# Patient Record
Sex: Male | Born: 1980 | Race: White | Hispanic: No | Marital: Single | Smoking: Former smoker
Health system: Southern US, Academic
[De-identification: ages and names within clinical notes are randomized; demographics above are authoritative.]

## PROBLEM LIST (undated history)

## (undated) DIAGNOSIS — J45909 Unspecified asthma, uncomplicated: Secondary | ICD-10-CM

## (undated) DIAGNOSIS — F419 Anxiety disorder, unspecified: Secondary | ICD-10-CM

## (undated) DIAGNOSIS — F329 Major depressive disorder, single episode, unspecified: Secondary | ICD-10-CM

## (undated) DIAGNOSIS — F319 Bipolar disorder, unspecified: Secondary | ICD-10-CM

## (undated) DIAGNOSIS — F32A Depression, unspecified: Secondary | ICD-10-CM

## (undated) HISTORY — PX: CHOLECYSTECTOMY: SHX55

## (undated) HISTORY — DX: Bipolar disorder, unspecified: F31.9

## (undated) HISTORY — PX: ABDOMINAL HERNIA REPAIR: SHX539

## (undated) HISTORY — DX: Anxiety disorder, unspecified: F41.9

## (undated) HISTORY — DX: Unspecified asthma, uncomplicated: J45.909

## (undated) HISTORY — DX: Depression, unspecified: F32.A

## (undated) HISTORY — DX: Bipolar disorder, unspecified (CMS HCC): F31.9

---
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
## 2003-04-17 ENCOUNTER — Ambulatory Visit (INDEPENDENT_AMBULATORY_CARE_PROVIDER_SITE_OTHER): Payer: Self-pay | Admitting: Family Medicine

## 2003-05-11 ENCOUNTER — Ambulatory Visit (INDEPENDENT_AMBULATORY_CARE_PROVIDER_SITE_OTHER): Payer: Self-pay | Admitting: Family Medicine

## 2003-06-08 ENCOUNTER — Ambulatory Visit (INDEPENDENT_AMBULATORY_CARE_PROVIDER_SITE_OTHER): Payer: Self-pay

## 2003-07-06 ENCOUNTER — Ambulatory Visit (INDEPENDENT_AMBULATORY_CARE_PROVIDER_SITE_OTHER): Payer: Self-pay | Admitting: Family Medicine

## 2003-09-30 ENCOUNTER — Ambulatory Visit (HOSPITAL_COMMUNITY): Payer: Self-pay

## 2003-10-04 ENCOUNTER — Ambulatory Visit (INDEPENDENT_AMBULATORY_CARE_PROVIDER_SITE_OTHER): Payer: Self-pay | Admitting: Family Medicine

## 2003-10-06 ENCOUNTER — Ambulatory Visit (HOSPITAL_COMMUNITY): Payer: Self-pay

## 2003-10-13 ENCOUNTER — Encounter (FREE_STANDING_LABORATORY_FACILITY): Payer: Self-pay | Admitting: Pathology

## 2003-10-13 ENCOUNTER — Ambulatory Visit (HOSPITAL_COMMUNITY): Payer: Self-pay

## 2003-11-02 ENCOUNTER — Ambulatory Visit (HOSPITAL_COMMUNITY): Payer: Self-pay

## 2003-11-24 ENCOUNTER — Ambulatory Visit (HOSPITAL_COMMUNITY): Payer: Self-pay

## 2003-12-06 ENCOUNTER — Ambulatory Visit (INDEPENDENT_AMBULATORY_CARE_PROVIDER_SITE_OTHER): Payer: Self-pay | Admitting: Family Medicine

## 2003-12-29 ENCOUNTER — Ambulatory Visit (HOSPITAL_COMMUNITY): Payer: Self-pay

## 2004-03-31 ENCOUNTER — Ambulatory Visit (HOSPITAL_COMMUNITY): Payer: Self-pay

## 2004-04-11 ENCOUNTER — Ambulatory Visit (INDEPENDENT_AMBULATORY_CARE_PROVIDER_SITE_OTHER): Payer: Self-pay | Admitting: Family Medicine

## 2005-01-17 ENCOUNTER — Ambulatory Visit (INDEPENDENT_AMBULATORY_CARE_PROVIDER_SITE_OTHER): Payer: Self-pay

## 2005-03-16 ENCOUNTER — Ambulatory Visit (INDEPENDENT_AMBULATORY_CARE_PROVIDER_SITE_OTHER): Payer: Self-pay

## 2005-05-01 ENCOUNTER — Ambulatory Visit (INDEPENDENT_AMBULATORY_CARE_PROVIDER_SITE_OTHER): Payer: Self-pay

## 2005-05-04 ENCOUNTER — Ambulatory Visit (INDEPENDENT_AMBULATORY_CARE_PROVIDER_SITE_OTHER): Payer: Self-pay

## 2005-10-11 ENCOUNTER — Ambulatory Visit (INDEPENDENT_AMBULATORY_CARE_PROVIDER_SITE_OTHER): Payer: Self-pay

## 2005-11-19 ENCOUNTER — Ambulatory Visit (INDEPENDENT_AMBULATORY_CARE_PROVIDER_SITE_OTHER): Payer: Self-pay | Admitting: Geriatric Medicine

## 2005-11-22 ENCOUNTER — Ambulatory Visit (INDEPENDENT_AMBULATORY_CARE_PROVIDER_SITE_OTHER): Payer: Self-pay

## 2005-12-24 ENCOUNTER — Ambulatory Visit (INDEPENDENT_AMBULATORY_CARE_PROVIDER_SITE_OTHER): Payer: Self-pay

## 2006-04-25 ENCOUNTER — Ambulatory Visit (INDEPENDENT_AMBULATORY_CARE_PROVIDER_SITE_OTHER): Payer: Self-pay | Admitting: Blood Banking & Transfusion Medicine

## 2006-07-25 ENCOUNTER — Ambulatory Visit (INDEPENDENT_AMBULATORY_CARE_PROVIDER_SITE_OTHER): Payer: Self-pay | Admitting: Blood Banking & Transfusion Medicine

## 2006-07-25 ENCOUNTER — Other Ambulatory Visit (INDEPENDENT_AMBULATORY_CARE_PROVIDER_SITE_OTHER): Payer: Self-pay

## 2006-09-26 ENCOUNTER — Ambulatory Visit (HOSPITAL_COMMUNITY): Payer: Self-pay

## 2006-10-24 ENCOUNTER — Ambulatory Visit (HOSPITAL_COMMUNITY): Payer: Self-pay

## 2006-12-19 ENCOUNTER — Encounter (HOSPITAL_COMMUNITY): Payer: Self-pay | Admitting: Student in an Organized Health Care Education/Training Program

## 2007-01-23 ENCOUNTER — Encounter (INDEPENDENT_AMBULATORY_CARE_PROVIDER_SITE_OTHER): Payer: Self-pay

## 2007-02-06 ENCOUNTER — Encounter (INDEPENDENT_AMBULATORY_CARE_PROVIDER_SITE_OTHER): Payer: Self-pay | Admitting: Student in an Organized Health Care Education/Training Program

## 2007-02-17 ENCOUNTER — Encounter (FREE_STANDING_LABORATORY_FACILITY)
Admit: 2007-02-17 | Discharge: 2007-02-17 | Disposition: A | Discharge status: Home / Self Care | Payer: Self-pay | Attending: Student in an Organized Health Care Education/Training Program | Admitting: Student in an Organized Health Care Education/Training Program

## 2007-02-17 DIAGNOSIS — F319 Bipolar disorder, unspecified: Secondary | ICD-10-CM | POA: Diagnosis present

## 2007-03-13 ENCOUNTER — Encounter (HOSPITAL_COMMUNITY): Payer: Self-pay | Admitting: Student in an Organized Health Care Education/Training Program

## 2007-03-27 ENCOUNTER — Encounter (HOSPITAL_COMMUNITY): Payer: Self-pay | Admitting: Student in an Organized Health Care Education/Training Program

## 2007-04-11 ENCOUNTER — Encounter (HOSPITAL_COMMUNITY): Payer: Self-pay | Admitting: Student in an Organized Health Care Education/Training Program

## 2007-04-11 ENCOUNTER — Other Ambulatory Visit (INDEPENDENT_AMBULATORY_CARE_PROVIDER_SITE_OTHER): Payer: Self-pay

## 2007-04-11 ENCOUNTER — Encounter (INDEPENDENT_AMBULATORY_CARE_PROVIDER_SITE_OTHER): Payer: Self-pay

## 2007-06-06 ENCOUNTER — Encounter (HOSPITAL_COMMUNITY): Payer: Self-pay

## 2007-10-24 ENCOUNTER — Encounter (HOSPITAL_COMMUNITY): Payer: Self-pay

## 2007-11-07 ENCOUNTER — Encounter (INDEPENDENT_AMBULATORY_CARE_PROVIDER_SITE_OTHER): Payer: Self-pay

## 2007-11-07 ENCOUNTER — Encounter (FREE_STANDING_LABORATORY_FACILITY): Admit: 2007-11-07 | Discharge: 2007-11-07 | Disposition: A | Discharge status: Home / Self Care | Payer: Self-pay

## 2007-11-07 DIAGNOSIS — F319 Bipolar disorder, unspecified: Secondary | ICD-10-CM | POA: Diagnosis present

## 2007-11-07 LAB — CREATININE WITH EGFR: ESTIMATED GLOMERULAR FILTRATION RATE: >59 ml/min/1.73m2 (ref >59)

## 2007-11-07 LAB — LITHIUM LEVEL: LITHIUM: 0.24 mmol/L — ABNORMAL LOW (ref 0.5–1.5)

## 2008-01-02 ENCOUNTER — Ambulatory Visit (INDEPENDENT_AMBULATORY_CARE_PROVIDER_SITE_OTHER): Payer: Self-pay

## 2008-01-02 MED ORDER — LITHIUM CARBONATE ER 450 MG TABLET,EXTENDED RELEASE
450.00 mg | ORAL_TABLET | Freq: Two times a day (BID) | ORAL | Status: DC
Start: 2008-01-02 — End: 2008-03-22

## 2008-01-02 MED ORDER — BUSPIRONE 10 MG TABLET
10.00 mg | ORAL_TABLET | Freq: Three times a day (TID) | ORAL | Status: DC
Start: 2008-01-02 — End: 2008-03-22

## 2008-01-02 MED ORDER — SERTRALINE 100 MG TABLET
100.00 mg | ORAL_TABLET | Freq: Two times a day (BID) | ORAL | Status: DC
Start: 2008-01-02 — End: 2008-03-22

## 2008-01-23 NOTE — Progress Notes (Signed)
I saw and evaluated the patient. I reviewed the resident's note. I agree with the findings and plan of care as documented in the resident's note. Any exceptions/additions are noted.

## 2008-03-09 ENCOUNTER — Encounter (HOSPITAL_COMMUNITY): Payer: Self-pay

## 2008-03-22 ENCOUNTER — Ambulatory Visit (INDEPENDENT_AMBULATORY_CARE_PROVIDER_SITE_OTHER): Payer: Self-pay

## 2008-03-22 MED ORDER — LITHIUM CARBONATE ER 450 MG TABLET,EXTENDED RELEASE
450.00 mg | ORAL_TABLET | Freq: Two times a day (BID) | ORAL | Status: DC
Start: 2008-03-22 — End: 2008-09-01

## 2008-03-22 MED ORDER — SERTRALINE 100 MG TABLET
100.00 mg | ORAL_TABLET | Freq: Two times a day (BID) | ORAL | Status: DC
Start: 2008-03-22 — End: 2008-09-01

## 2008-03-22 MED ORDER — BUSPIRONE 15 MG TABLET
15.00 mg | ORAL_TABLET | Freq: Two times a day (BID) | ORAL | Status: DC
Start: 2008-03-22 — End: 2008-09-01

## 2008-05-24 ENCOUNTER — Encounter (HOSPITAL_COMMUNITY): Payer: Self-pay

## 2008-07-05 ENCOUNTER — Encounter (HOSPITAL_COMMUNITY): Payer: Self-pay

## 2008-09-01 ENCOUNTER — Ambulatory Visit (INDEPENDENT_AMBULATORY_CARE_PROVIDER_SITE_OTHER): Payer: Self-pay

## 2008-09-01 ENCOUNTER — Encounter (FREE_STANDING_LABORATORY_FACILITY): Admit: 2008-09-01 | Discharge: 2008-09-01 | Disposition: A | Discharge status: Home / Self Care | Payer: Self-pay

## 2008-09-01 DIAGNOSIS — F319 Bipolar disorder, unspecified: Secondary | ICD-10-CM | POA: Diagnosis present

## 2008-09-01 LAB — LITHIUM LEVEL
DATE OF LAST DOSE: 122309
LITHIUM: 0.29 mmol/L — ABNORMAL LOW (ref 0.5–1.5)
TIME OF LAST DOSE: 2400

## 2008-09-01 LAB — CREATININE WITH EGFR: ESTIMATED GLOMERULAR FILTRATION RATE: >59 ml/min/1.73m2 (ref >59)

## 2008-09-01 MED ORDER — LITHIUM CARBONATE ER 450 MG TABLET,EXTENDED RELEASE
450.00 mg | ORAL_TABLET | Freq: Two times a day (BID) | ORAL | Status: DC
Start: 2008-09-01 — End: 2008-11-24

## 2008-09-01 MED ORDER — SERTRALINE 100 MG TABLET
100.00 mg | ORAL_TABLET | Freq: Two times a day (BID) | ORAL | Status: DC
Start: 2008-09-01 — End: 2008-11-24

## 2008-09-01 MED ORDER — BUSPIRONE 15 MG TABLET
15.00 mg | ORAL_TABLET | Freq: Two times a day (BID) | ORAL | Status: DC
Start: 2008-09-01 — End: 2008-11-24

## 2008-09-29 ENCOUNTER — Encounter (INDEPENDENT_AMBULATORY_CARE_PROVIDER_SITE_OTHER): Payer: Self-pay | Admitting: Psychology

## 2008-10-13 ENCOUNTER — Encounter (HOSPITAL_COMMUNITY): Payer: Self-pay | Admitting: Psychology

## 2008-11-11 ENCOUNTER — Encounter (HOSPITAL_COMMUNITY): Payer: Self-pay | Admitting: Psychology

## 2008-11-24 ENCOUNTER — Ambulatory Visit (INDEPENDENT_AMBULATORY_CARE_PROVIDER_SITE_OTHER): Payer: Self-pay

## 2008-11-24 ENCOUNTER — Encounter (HOSPITAL_COMMUNITY): Payer: Self-pay

## 2008-11-24 MED ORDER — SERTRALINE 100 MG TABLET
100.00 mg | ORAL_TABLET | Freq: Two times a day (BID) | ORAL | Status: DC
Start: 2008-11-24 — End: 2009-06-02

## 2008-11-24 MED ORDER — LITHIUM CARBONATE ER 450 MG TABLET,EXTENDED RELEASE
450.00 mg | ORAL_TABLET | Freq: Two times a day (BID) | ORAL | Status: DC
Start: 2008-11-24 — End: 2009-06-02

## 2008-11-24 MED ORDER — BUSPIRONE 15 MG TABLET
15.00 mg | ORAL_TABLET | Freq: Two times a day (BID) | ORAL | Status: DC
Start: 2008-11-24 — End: 2009-06-02

## 2008-11-30 ENCOUNTER — Encounter (HOSPITAL_COMMUNITY): Payer: Self-pay | Admitting: Psychology

## 2008-12-01 ENCOUNTER — Encounter (HOSPITAL_COMMUNITY): Payer: Self-pay

## 2009-01-14 ENCOUNTER — Encounter (HOSPITAL_COMMUNITY): Payer: Self-pay | Admitting: Psychology

## 2009-02-21 NOTE — Behavioral Health (Signed)
 [facility]  Department of Behavioral Medicine and Psychiatry  Outpatient Services  [address]  [place], [place] [address]      PATIENT NAME: Frederick Schultz, Frederick Schultz  CHART NUMBER: [id number]  DATE OF BIRTH: 23-Jul-1981  DATE OF SERVICE: 09/29/2008    TIME IN:  11:30 a.m.  TIME OUT:  12:15 p.m.    SUBJECTIVE: This is my first session with the patient on referral from Dr. Avis. Although he has a diagnosis of social phobia, his presentation is unusual. He talks at length about seeing shortcomings in other people and being very frustrated because they have friends and happy lives and he does not. At the same time, he acknowledges some loneliness and the fact that he has only one friend. His frustration at people is sufficient that he quit several jobs because he was so frustrated, he feared that he might lash out at somebody in anger. He states that he sleeps much of the day because there is nothing to get up for.    OBJECTIVE: Mood was anxious and frustrated, affect was congruent and consistent with mood.    ASSESSMENT:  Axis I: Bipolar disorder.   Social phobia.  Axis II: Deferred.  Axis III: None.  Axis IV: Personal, social, and financial stressors  Axis IV: GAF = 45.    PLAN: He has concerns about the financial cost of therapy, but did agree to come back in two weeks.      DOROTHA Glendia Bough, PhD, ABPP, FAED  Professor; Director; Eating, Body Image & Weight Disorder Program  [place] Department of Behavioral Medicine    GF/FD/[id number]; D: 09/29/2008 12:41:21; T: 10/01/2008 07:01:45

## 2009-03-05 ENCOUNTER — Emergency Department (HOSPITAL_COMMUNITY): Admission: EM | Admit: 2009-03-05 | Discharge: 2009-03-06 | Payer: Self-pay | Admitting: Emergency Medicine

## 2009-03-22 ENCOUNTER — Encounter (HOSPITAL_COMMUNITY): Payer: Self-pay | Admitting: Psychiatry

## 2009-06-02 ENCOUNTER — Ambulatory Visit (INDEPENDENT_AMBULATORY_CARE_PROVIDER_SITE_OTHER): Payer: Self-pay | Admitting: Psychiatry

## 2009-06-02 MED ORDER — SERTRALINE 100 MG TABLET
100.00 mg | ORAL_TABLET | Freq: Two times a day (BID) | ORAL | Status: DC
Start: 2009-06-02 — End: 2012-08-18

## 2009-06-02 MED ORDER — LITHIUM CARBONATE ER 450 MG TABLET,EXTENDED RELEASE
450.00 mg | ORAL_TABLET | Freq: Two times a day (BID) | ORAL | Status: DC
Start: 2009-06-02 — End: 2012-08-18

## 2009-06-02 NOTE — Progress Notes (Signed)
Frederick Schultz   [id number]  1981-07-28  06/02/2009  DOS: 06/02/2009      CC:  I am here for my medication    SUBJECTIVE:  Pt has been seeing Dr. Maryan Rued bipolar disorder, panic attacks and social anxiety disorder.  Pt recently started school, in general studies and has a pending remedical math class.  Wants to major in Psychology. No episodes of mania or mood lability or issues there.  Pt states hasnt taken buspar states hadnt worked well.  No SI HI no AVH    OBJECTIVE:     Orientation: A and O X 3  Appearance:  Well groomed, pleasant and cooperative  Eye Contact:  good  Attention:  Adequate for conversation  Speech:  Regular rate and volume  Motor:  No movement abnormalities  Mood:  eythymic  Affect:  congruent  Thought Process:  Linear and goal directed  Thought Content:  No SI or HI. No delusions, preoccupations or phobias  Perception:  No auditory or visual hallucinations  Cognition:  With in normal limits  Insight:  fair  Judgement:  Fair    There were no vitals filed for this visit.    MEDICATIONS:  Current outpatient prescriptions   Medication Sig   . lithium carbonate (ESKALITH CR) 450 mg TbSR take 1 Tab by mouth Twice daily.    . sertraline (ZOLOFT) 100 mg Tab take 1 Tab by mouth Twice daily.    Marland Kitchen DISCONTD: busPIRone (BUSPAR) 15 mg Tab take 1 Tab by mouth Twice daily.    Marland Kitchen DISCONTD: tramadol (ULTRAM) 50 mg Tab 2 tablets, oral, once every eight hours, PRN         ASSESSMENT:  Patient Active Problem List   Diagnoses Code   . Asthma 493.10   . Hyperlipidemia 272.2   . Hypertension 401.9     Assesment:     Axis I Bipolar disorder Type I            Social anxiety disorder            Panic disorder  PLAN:      Check lithium level in AM instructed to return to lab  Check TSH and BMP   Renew patients medications as above.  RTC 2 months    Patient advised to call with any questions or concerns. Patient advised to report to nearest emergency department or to call 911 if having any suicidal or homicidal ideations.

## 2009-06-30 NOTE — Progress Notes (Signed)
I saw and evaluated the patient. I reviewed the resident's note. I agree with the findings and plan of care as documented in the resident's note. Any exceptions/additions are noted.      Kenadee Gates P Kashay Cavenaugh, MD 06/30/2009, 3:18 PM

## 2009-07-28 ENCOUNTER — Encounter (HOSPITAL_COMMUNITY): Payer: Self-pay | Admitting: Psychiatry

## 2009-11-14 ENCOUNTER — Encounter (HOSPITAL_COMMUNITY): Payer: Self-pay | Admitting: Psychiatry

## 2011-12-24 ENCOUNTER — Ambulatory Visit (INDEPENDENT_AMBULATORY_CARE_PROVIDER_SITE_OTHER): Payer: Self-pay

## 2011-12-24 ENCOUNTER — Encounter (INDEPENDENT_AMBULATORY_CARE_PROVIDER_SITE_OTHER): Payer: Self-pay

## 2011-12-24 VITALS — BP 151/109 | HR 76 | Temp 98.6°F | Resp 18 | Wt 224.2 lb

## 2011-12-24 MED ORDER — BENZONATATE 200 MG CAPSULE
200.00 mg | ORAL_CAPSULE | Freq: Three times a day (TID) | ORAL | Status: DC | PRN
Start: 2011-12-24 — End: 2012-08-13

## 2011-12-24 MED ORDER — FLUTICASONE PROPIONATE 50 MCG/ACTUATION NASAL SPRAY,SUSPENSION
1.0000 | Freq: Every day | NASAL | Status: DC
Start: 2011-12-24 — End: 2012-08-13

## 2011-12-24 NOTE — Patient Instructions (Signed)
 Cool Mist Vaporizers  Vaporizers may help relieve the symptoms of a cough and cold. By adding water to the air, mucus may become thinner and less sticky. This makes it easier to breathe and cough up secretions. Vaporizers have not been proven to show they help with colds. You should not use a vaporizer if you are allergic to mold. Cool mist vaporizers do not cause serious burns like hot mist vaporizers (steamers).  HOME CARE INSTRUCTIONS   Follow the package instructions for your vaporizer.    Use a vaporizer that holds a large volume of water (1 to 2 gallons [5.7 to 7.5 liters]).    Do not use anything other than distilled water in the vaporizer.    Do not run the vaporizer all of the time. This can cause mold or bacteria to grow in the vaporizer.    Clean the vaporizer after each time you use it.    Clean and dry the vaporizer well before you store it.    Stop using a vaporizer if you develop worsening respiratory symptoms.   Document Released: 05/24/2004 Document Revised: 08/16/2011 Document Reviewed: 04/21/2009  [facility] Patient Information 2012 [place], [place].    Cough, Viral  You have been seen today for your cough. Your caregiver feels that your cough is caused by a virus. Viral infections must run their course and will not respond to antibiotics.  The following information may or may not apply to you. It is possible that some of your lab work or X-rays may need to be available to make a final diagnosis. Sometimes some of these tests require repeating.   X-rays may have been taken today and will be read again by a radiologist. The official reading will be back in 1 to 2 days. Call to see if there is a difference between the initial reading and the radiologist's.    Lab work may have been done today and results should be back within 24 hours. Call in 1 day for your lab results or as directed.    Cultures may have been done today. These generally take at least 2 days for results. Call in 2 to 3  days for your results or as directed.    You may have had special procedures done today and the results will not be available for a few days. Call for your results as directed.   Not all test results are available during your visit. If your test results are not back during the visit, make an appointment with your caregiver to find out the results. Do not assume everything is normal if you have not heard from your caregiver or the medical facility. It is important for you to follow up on all of your test results.  HOME CARE INSTRUCTIONS   Cough suppressants may be used as directed by your caregiver. Keep in mind that coughing helps clear mucus and infection out of the respiratory tract. It is best to only use cough suppressants to allow your child to rest. For children under the age of 4 years, use cough suppressants only as directed by your child's caregiver.    Your caregiver may also prescribe an expectorant to loosen the mucus to be coughed up.    Only take over-the-counter or prescription medicines for pain, discomfort, or fever as directed by your caregiver.    Smoking is the most common cause of bronchitis and coughing. It is a large cause of pneumonia. Stopping this habit is important.    A  cold steam vaporizer or humidifier in your room or home may help loosen secretions.    Cough is most often worse at night. Sleeping in a semi-upright position or using a couple of pillows will help with this.    Get rest as you feel it is needed. Your body will help guide you in this manner.   SEEK IMMEDIATE MEDICAL CARE IF:   You develop more pus like (purulent) sputum, have an uncontrolled fever, or become more ill. This is especially true if you are elderly or weakened (debilitated) from any other disease.    You cannot control your cough with suppressants and are losing sleep.    You begin coughing up blood.    Anytime it becomes difficult to breathe (shortness of breath).    You develop pain which is  getting worse or is uncontrolled with medications.    You or your child has an oral temperature above 101, not controlled by medicine.    Your baby is older than 3 months with a rectal temperature of 102 F (38.9 C) or higher.    Your baby is 52 months old or younger with a rectal temperature of 100.4 F (38 C) or higher.    If any of the symptoms that first brought you in for care are getting worse rather than better.   MAKE SURE YOU:     Understand these instructions.    Will watch your condition.    Will get help right away if you are not doing well or get worse.   Document Released: 11/17/2003 Document Re-Released: 02/14/2010  Exit[employer] Patient Information 2012 [place] B[place].     [facility]   [facility]  [address], [address]  [place], [place] [address]  Phone: [phone]  Fax: [phone]  [email or web address]               Open Daily 8:00am - 8:00pm ~ Closed Thanksgiving and Christmas Day     Attending Caregiver: Landry Mon, FNP-C    Today's orders:   Orders Placed This Encounter   . Benzonatate  (TESSALON ) 200 mg Oral Capsule   . fluticasone  (FLONASE ) 50 mcg/actuation Nasal Spray, Suspension        Prescription(s) E-Rx to:  [id number] - [place], [place] - [address]    ________________________________________________________________________  Short Term Disability and Family Medical Leave Act  [facility] does NOT provide assistance with any disability applications.  If you feel your medical condition requires you to be on disability, you will need to follow up with  Your primary care physician or a specialist.  We apologize for any inconvenience.    For Medication Prescribed by [facility]:  As an Urgent Care facility, our clinic does NOT offer prescription refills over the telephone.    If you need more of the medication one of our medical providers prescribed, you will  Either need to be re-evaluated by us  or see your primary  care physician.    ________________________________________________________________________      It is very important that we have a phone number that is the single best way to contact you in the event that we become aware of important clinical information or concerns after your discharge.  If the phone number you provided at registration is NOT this number you should inform staff and registration prior to leaving.      Your treatment and evaluation today was focused on identifying and treating  potentially emergent conditions based on your presenting signs, symptoms, and history.  The resulting initial clinical impression and treatment plan is not intended to be definitive or a substitute for a full physical examination and evaluation by your primary care provider.  If your symptoms persist, worsen, or you develop any new or concerning symptoms, you need to be evaluated.      If you received x-rays during your visit, be aware that the final and formal interpretation of those films by a radiologist may occur after your discharge.  If there is a significant discrepancy identified after your discharge, we will contact you at th telephone number provided at registration.      If you received a pelvic exam, you may have cultures pending for sexually transmitted diseases.  Positive cultures are reported to the [facility] Department of Health as required by state law.  You should be contacted if you cultures are positive.  We will not contact you if they are negative.  You may contact the Health Information Management Office of [facility] to get a copy of your results.  You did NOT receive a PAP smear (the screening test for cervical).  This specific test for women is best performed by your gynecologist or primary care provider when indicated.      If you are over 80 year old, we cannot discuss your personal health information with a parent, spouse, family member, or anyone else without your express consent.  This does  not include those who have legitimate access to your records and information to assist in your care under the provisions of HIPAA [facility] Portability and Accountability Act) law, or those to whom you have previously given express written consent to do so, such a legal guardian or Power of [place].      You may have received medication that may cause you to feel drowsy and/or light headed for several hours.  You may even experience some amnesia of your stay.  You should avoid operating a motor vehicle or performing any activity requiring complete alertness or coordination until you feel fully awake (approximately 24-48 hours).  Avoid alcoholic beverages.  You may also have a dry mouth for several hours.  This is a normal side effect and will disappear as the effects of the medication wear off.      Instructions discussed with patient upon discharge by clinical staff with all questions answered.  Please call [facility] [phone]) if any further questions.  Go immediately to the emergency department if any concern or worsening symptoms.      Landry Mon, ANP 12/24/2011, 5:33 PM

## 2011-12-24 NOTE — Progress Notes (Signed)
History of Present Illness: Frederick Schultz is a 31 y.o. male who presents to the Urgent Care today with chief complaint of Cough     Location: head, chest  Quality: scratchy, congestion, ache  Onset: yesterday  Severity: moderate  Timing: intermittent  Context: states he didn't sleep at all last night secondary to cough  Treatment tried: none  Aggravating factors: cough, nasal congestion  Pertinent Negatives: afebrile, no SOB  Associated symptoms: cough, nasal congestion    I reviewed and confirmed the patient's past medical history taken by the nurse or medical assistant with the addition of the following:    Past Medical History:    Past Medical History   Diagnosis Date   . Bipolar 1 disorder      Past Surgical History:    History reviewed. No pertinent past surgical history.  Allergies:  Allergies   Allergen Reactions   . Asa Buff (Mag Carb-Al Glyc) (Aspirin, Buffered)      Medications:    Current Outpatient Prescriptions   Medication Sig   . PSEUDOEPHEDRINE HCL (SUDAFED ORAL) Take by mouth   . Benzonatate (TESSALON) 200 mg Oral Capsule Take 1 Cap (200 mg total) by mouth Three times a day as needed for Cough   . fluticasone (FLONASE) 50 mcg/actuation Nasal Spray, Suspension 1 Spray by Each Nostril route Once a day   . sertraline (ZOLOFT) 100 mg Tab take 1 Tab by mouth Twice daily.    Marland Kitchen lithium carbonate (ESKALITH CR) 450 mg TbSR take 1 Tab by mouth Twice daily.      Social History:    History   Substance Use Topics   . Smoking status: Never Smoker    . Smokeless tobacco: Not on file   . Alcohol Use: Yes      rare     Family History: No significant family history.  Family History   Problem Relation Age of Onset   . Healthy Mother    . Healthy Father      Review of Systems:  General: no fever, no myalgias and no fatigue  ENT:  no sore throat, no otalgia right, no otalgia left, runny nose and nasal congestion  Pulmonary: no sore throat, no otalgia right, no otalgia left, runny nose and scratchy throat     Physical Exam:  Vital signs:   Filed Vitals:    12/24/11 1714   BP: 151/109   Pulse: 76   Temp: 37 C (98.6 F)   TempSrc: Tympanic   Resp: 18   Weight: 101.7 kg (224 lb 3.3 oz)   SpO2: 96%     General: No acute distress  Eyes: PERRL and normal conjunctiva  ENT: normal EAC's, normal TM's, MMM and mildly erythemic oropharynx without exudate  Pulmonary: clear to auscultation bilaterally and no wheezes  Cardiovascular: normal S1/S2  Skin: warm/dry and no rash  Psychiatric: appropriate affect and behavior  Neurologic: alert and oriented x 3  Hem/Lymph no lymphadenopathy    Data Reviewed  Not applicable    Course: Condition at discharge: Stable    Differential Diagnosis: URI vs sinusitis vs bronchtitis    Assessment:   1. Nasal congestion    2. Cough        Plan:    Orders Placed This Encounter   . Benzonatate (TESSALON) 200 mg Oral Capsule   . fluticasone (FLONASE) 50 mcg/actuation Nasal Spray, Suspension      Plan was discussed and patient/parent/guardian verbalized understanding.  If symptoms are not improving  the patient should return to the Urgent Care for further evaluation. If symptoms are worsening or emergent present to Emergency Department for  higher level of evaluation and care.  Supervising physician was physically present in Urgent Care and available for consultation and did not participate in the care of this patient.   Theresia Majors, ANP 12/24/2011, 5:39 PM

## 2012-08-13 ENCOUNTER — Ambulatory Visit (INDEPENDENT_AMBULATORY_CARE_PROVIDER_SITE_OTHER): Payer: Self-pay | Admitting: Psychiatry

## 2012-08-13 VITALS — BP 118/86 | HR 72 | Resp 20 | Ht 66.0 in | Wt 224.0 lb

## 2012-08-18 ENCOUNTER — Ambulatory Visit (INDEPENDENT_AMBULATORY_CARE_PROVIDER_SITE_OTHER): Payer: Self-pay | Admitting: Psychiatry

## 2012-08-18 VITALS — BP 142/95 | HR 72 | Resp 20 | Ht 66.0 in | Wt 226.0 lb

## 2012-08-18 DIAGNOSIS — I10 Essential (primary) hypertension: Secondary | ICD-10-CM

## 2012-08-18 MED ORDER — LITHIUM CARBONATE ER 450 MG TABLET,EXTENDED RELEASE
450.0000 mg | ORAL_TABLET | Freq: Two times a day (BID) | ORAL | Status: DC
Start: 2012-08-18 — End: 2013-01-05

## 2012-08-18 MED ORDER — PROPRANOLOL 10 MG TABLET
10.0000 mg | ORAL_TABLET | Freq: Three times a day (TID) | ORAL | Status: DC
Start: 2012-08-18 — End: 2013-01-05

## 2012-08-18 MED ORDER — SERTRALINE 100 MG TABLET
200.0000 mg | ORAL_TABLET | Freq: Every day | ORAL | Status: DC
Start: 2012-08-18 — End: 2013-01-05

## 2012-08-18 NOTE — H&P (Signed)
[facility] PSYCHIATRY OUTPATIENT NOTE  NEW STUDENT EVALUATION    Name: Frederick Schultz  MRN:  [id number]  DOB: 23-Jul-1981  DOS: 08/18/2012    New Patient      Time in: 105  Time out: 217    IDENTIFICATION  Frederick Schultz is a 31 y.o. year-old male self referred for a psychiatric evaluation.    HPI  Presenting concerns: "I need to refills"; "I wish I were a better student".  He reports psychiatric symptoms and care dating back to his early childhood that was associated with an inpatient admission associated with SI that he said he used to try to get more attention. He said he has struggled throughout college with his motivation a found that he has not engaged in his courses despite his wish to get a college degree to find a career where he will make an impact on the world. He has also struggled to make friends and especially frustrating to him has been that he has not had a romantic relationship. He reports a long history of feeling awkward in social situations and associated anxiety. He increased his aclohol use several years back to daily use which he said interfered with many of his daily activies. He also experienced depression in this setting and was diagnosed with type II bipolar disorder by Dr. Maryan Rued. He continued his psychiatric care until 6 months ago with Dr. Gwenevere Abbot at [place] but recently decided to transfer his care for convenience and changes with scheduling a [place]. He has not check his Dierdre Searles level in 3 mo and said he experieces blood phobia.   Marked improvement with medication, 6 years, dx bipolar by Dr. Maryan Rued    Symptoms reported: difficult to control anger; said often feels others and world around him is unjust; axiety in social situations with panic attacks; difficult to control worry.      PAST PSYCHIATRIC HISTORY  Inpatient treatments:     -  [facility] 20 years ago, said he threatened suicide to get attention - "I was a very manipulative child"; said he will intentionally make statements to shock others such as blasphemy  Outpatient treatments:     -  Sees psychiatrist at [place] - last visit 6 months ago; no recent counseling or therapy  Past medications trials:    -  Ritalin; VPA; Prozac; said many others but does not recall  Past Suicide attempts:   -  None. Occasional NSSI  - cutting - in distant past.    PAST MEDICAL HISTORY  Asthma    PAST SURGICAL HISTORY  Hernia repair at 31 yo    CURRENT MEDICATION  Outpatient Prescriptions Marked as Taking for the 08/18/12 encounter (Office Visit) with Lacretia Nicks, MD   Medication Sig   . lithium carbonate (ESKALITH CR) 450 mg TbSR take 1 Tab by mouth Twice daily.    . propranolol (INDERAL) 10 mg Oral Tablet Take 10 mg by mouth Three times a day   . sertraline (ZOLOFT) 100 mg Tab take 1 Tab by mouth Twice daily.      No Facility-Administered Medications for the 08/18/12 encounter (Office Visit) with Lacretia Nicks, MD.         ALLERGIES  Allergies   Allergen Reactions   . Asa Buff (Mag Carb-Al Glyc) (Aspirin, Buffered)          FAMILY MEDICAL AND PSYCHIATRIC HISTORY  Mother with depression; patient suspects mental illness with father; wonders if father has alcoholism.    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; previous GPA was "terrible"; current academic hours 12 - said he is not doing well academically; employment 0 hours/week; other responsibilities include - none reported; relationship status is single - said a major source of stress - said he has looked into to getting "neutered"; currently lives in mothers house in [place]; caffeine use was approximately 20-60 oz./day; alcohol use 0/7 - said he drank daily a year ago - abstained on own; no occasions of cannabis use in past week; other drug use reported - none; access to fire arms reported in current living environment - keeps pistol at home, with loaded clip and safety off.    Filed Vitals:    08/18/12 1241   BP: 142/95   Pulse: 72   Resp: 20   Height: 1.676 m (5\' 6" )   Weight: 102.513 kg (226 lb)       MSE  Arrived on time. Used good eye-contact with me. Casually dressed and appropriately groomed. Attitude toward me was open and cooperative. Speech was normal. There was no abnormal psychomotor activity. Restful sleep was reported 1 of the past 7 nights - wavers between sleeping all day or staying up all night. No recent appetite or weight changes were reported. Mood over the past two weeks was reported as "pretty good". Emotional responses during the evaluation were broad and somewhat jovial. Thought process was linear and goal directed. There were no paranoid or grandiose delusions. No thoughts of suicide or homicide were reported during the evaluation. No suicidal ideation or homicidal ideation was reported during the past two weeks. There was no history of past suicide attempts or NSSI. There was no history of past assaults on others. There was no history of auditory or visual hallucinations. The patient did not appear to be responding to internal stimuli. Insight was fair.    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 Li toxicity reviewed as well as medication interactions including caffeine; sleep restriction and poor quality sleep likely associacted with cognifive symptoms; [place] rescources reviewed.    [place]ance was reached for the following plan.    PLAN  He plans to schedule [facility] counseling  He plans to contact [employer] for academic succes coaching  Continue Zoloft - consonlidate dose to 200 mg daily  Continue Propranolol 10 mg TID  Continue Li 450 mg BID - he plans to get level and labs checked at next visit  SHS for hypertension   RTC 3 mo - he declined recommendation to return earlier

## 2013-01-05 ENCOUNTER — Ambulatory Visit (INDEPENDENT_AMBULATORY_CARE_PROVIDER_SITE_OTHER): Payer: Self-pay | Admitting: Psychiatry

## 2013-01-05 DIAGNOSIS — I10 Essential (primary) hypertension: Secondary | ICD-10-CM

## 2013-01-05 MED ORDER — PROPRANOLOL 10 MG TABLET
10.0000 mg | ORAL_TABLET | Freq: Three times a day (TID) | ORAL | Status: DC
Start: 2013-01-05 — End: 2013-01-05

## 2013-01-05 MED ORDER — SERTRALINE 100 MG TABLET
200.0000 mg | ORAL_TABLET | Freq: Every day | ORAL | Status: DC
Start: 2013-01-05 — End: 2015-12-19

## 2013-01-05 MED ORDER — LITHIUM CARBONATE ER 450 MG TABLET,EXTENDED RELEASE
450.0000 mg | ORAL_TABLET | Freq: Two times a day (BID) | ORAL | Status: DC
Start: 2013-01-05 — End: 2015-12-19

## 2013-01-05 MED ORDER — PROPRANOLOL 10 MG TABLET
10.0000 mg | ORAL_TABLET | Freq: Three times a day (TID) | ORAL | Status: DC
Start: 2013-01-05 — End: 2015-12-19

## 2013-01-05 MED ORDER — LITHIUM CARBONATE ER 450 MG TABLET,EXTENDED RELEASE
450.0000 mg | ORAL_TABLET | Freq: Two times a day (BID) | ORAL | Status: DC
Start: 2013-01-05 — End: 2013-01-05

## 2013-01-05 MED ORDER — SERTRALINE 100 MG TABLET
200.0000 mg | ORAL_TABLET | Freq: Every day | ORAL | Status: DC
Start: 2013-01-05 — End: 2013-01-05

## 2013-01-05 NOTE — Progress Notes (Signed)
[facility] PSYCHIATRY OUTPATIENT PROGRESS NOTE    Frederick Schultz   [id number]  13-May-1981  01/05/2013  FOLLOWUP INDIVIDUAL THERAPY-MED CHECK      Time in: 255  Time out: 50    S/O  He said he has had decreased motivation and feels more depressed; he said he decided to move out and live with roomates; he found this more stressful than he expected - he said he understands at an intellectual level but adjusting to living with peers; will be moving back in with mother because roomates are graduating and moving on; he hopes to return in the fall semester and wants to try again to find roomates; optimistic about future; no Li side-effects reported.    Alert. On time. Good eye contact. Casual and appropriate grooming - wearing large seal shirt. Sleep and appetite good. No abnormal PMA or tremor. Speech normal. Euthymic. Broad. Linear. No SI or HI. No AVH. Concentration good.      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  He plans to schedule [facility] counseling next fall through email after our next visit  Continue Zoloft 200 mg daily   Continue Propranolol 10 mg TID   Continue Li 450 mg BID  SHS for hypertension, labs including Li level, TSH, electrolytes  RTC 3 mo - he will give me his email address at that point to forward to [facility] couseling

## 2013-04-28 ENCOUNTER — Encounter (INDEPENDENT_AMBULATORY_CARE_PROVIDER_SITE_OTHER): Payer: Self-pay | Admitting: Psychiatry

## 2014-08-21 ENCOUNTER — Ambulatory Visit (INDEPENDENT_AMBULATORY_CARE_PROVIDER_SITE_OTHER): Payer: Medicaid Other

## 2014-08-21 ENCOUNTER — Encounter (INDEPENDENT_AMBULATORY_CARE_PROVIDER_SITE_OTHER): Payer: Self-pay

## 2014-08-21 VITALS — BP 158/108 | HR 84 | Temp 98.0°F | Resp 18 | Ht 66.0 in | Wt 238.5 lb

## 2014-08-21 DIAGNOSIS — Z6838 Body mass index (BMI) 38.0-38.9, adult: Secondary | ICD-10-CM

## 2014-08-21 DIAGNOSIS — M2580 Other specified joint disorders, unspecified joint: Secondary | ICD-10-CM

## 2014-08-21 DIAGNOSIS — F319 Bipolar disorder, unspecified: Secondary | ICD-10-CM

## 2014-08-21 NOTE — Patient Instructions (Signed)
[facility]      Operated by [facility]  [address]  [place], [place] [address]  Phone: [phone]  Fax: [phone]  [email or web address]  Open Daily 8:00a - 8:00p    Closed Thanksgiving and Christmas Day  [facility]     Operated by [facility]  [address]  [facility]  [place], [place] [address]  Phone: [phone]  Fax: [phone]  [email or web address]  Open M-F  8:00a - 8:00p  Sat              10:00a - 4:00p  Sun             Closed  Closed all [place] Holidays        Attending Caregiver: Janifer AdieMark E Zachery Niswander, MD      Today's orders:   Orders Placed This Encounter    AMB CONSULT/REFERRAL PODIATRY    AMB CONSULT/REFERRAL PSYCHIATRY        Prescription(s) E-Rx to:  [facility] - [place], [place] - [address]    ________________________________________________________________________  Frederick BondsShort Term Disability and Family Medical Leave Act  [facility] does NOT provide assistance with any disability applications.  If you feel your medical condition requires you to be on disability, you will need to follow up with  your primary care physician or a specialist.  We apologize for any inconvenience.    For Medication Prescribed by [facility]:  As an Urgent Care facility, our clinic does NOT offer prescription refills over the telephone.    If you need more of the medication one of our medical providers prescribed, you will  either need to be re-evaluated by us or see your primary care physician.    ________________________________________________________________________      It is very important that we have a phone number.  This is the single best way to contact you in the event that we become aware of important clinical information or concerns after your discharge.  If the phone number you provided at registration is NOT this number you should inform staff and  registration prior to leaving.      Your treatment and evaluation today was focused on identifying and treating potentially emergent conditions based on your presenting signs, symptoms, and history.  The resulting initial clinical impression and treatment plan is not intended to be definitive or a substitute for a full physical examination and evaluation by your primary care provider.  If your symptoms persist, worsen, or you develop any new or concerning symptoms, you need to be evaluated.      If you received x-rays during your visit, be aware that the final and formal interpretation of those films by a radiologist may occur after your discharge.  If there is a significant discrepancy identified after your discharge, we will contact you at the telephone number provided at registration.      If you received a pelvic exam, you may have cultures pending for sexually transmitted diseases.  Positive cultures are reported to the [facility] Department of Health as required by state law.  You may contact the Health Information Management Office of [facility] to get a copy of your results.     If you are over 33 year old, we cannot discuss your personal health information with a parent, spouse, family member, or anyone else without your consent.  This does not include those who have legitimate access  to your records and information to assist in your care under the provisions of HIPAA [facility](Health Insurance Portability and Accountability Act) law, or those to whom you have previously given written consent to do so, such a legal guardian or Power of [place]orney.      Instructions are discussed with patient upon discharge by clinical staff with all questions answered.  Please call [facility] [phone]) if any further questions develop.  Go immediately to the emergency department if any concerns or worsening symptoms.      Janifer AdieMark E Emmeline Winebarger, MD 08/21/2014, 14:22

## 2014-08-21 NOTE — Progress Notes (Signed)
I personally performed the services described in this documentation, as scribed  in my presence, and it is both accurate  and complete.    Meshach Perry E Roise Emert, MD

## 2014-08-21 NOTE — Progress Notes (Signed)
History of Present Illness: Frederick Schultz is a 33 y.o. male who presents today with chief complaint of    Chief Complaint     Toe Pain Base of left big toe for week.  No known injury      .     Pt presenting with c/o toe pain. Pt reports having L big toe pain x 1 week. Pt does not recall any injury to toe. States that he had a cramp in his L calf for a couple of days and once the calf pain relieved the L big toe pain started. Pt states that he may have been walking "funny " to favor his L calf. Associated signs and sx include toe stiffness, pain with ambulation, and pain with pressure on toe. Denies swelling or discoloration. Attempted treatments include ice and elevation. Pt took a few days off of work due to pain. States that he has not been walking more than usual. No hx of broken toes. Pt expresses concern for hypertension. Family hx of hypertension.    Location: L big toe  Quality: stiffness, sharp pain  Onset: 1 week  Severity: mild  Timing: constant  Context: Pt has no known injury to toe, prior to toe pain pt had a cramping in his L calf for a few days  Modifying factors: ice, elevation  Associated symptoms: L hallux pain, pain with ambulation, pain with pressure to L hallux    I reviewed and confirmed the patient's past medical history taken by the nurse or medical assistant with the addition of the following:    Past Medical History:    Past Medical History   Diagnosis Date    Bipolar 1 disorder          Past Surgical History:    History reviewed. No pertinent past surgical history.      Allergies:  Allergies   Allergen Reactions    Asa Buff (Mag Carb-Al Glyc) [Aspirin, Buffered]      Medications:    Current Outpatient Prescriptions   Medication Sig    lithium carbonate (ESKALITH CR) 450 mg Oral Tablet Sustained Release Take 1 Tab (450 mg total) by mouth Twice daily (Patient not taking: Reported on 08/21/2014)    propranolol (INDERAL) 10 mg Oral Tablet Take 1 Tab (10 mg total) by mouth Three times a  day (Patient not taking: Reported on 08/21/2014)    sertraline (ZOLOFT) 100 mg Oral Tablet Take 2 Tabs (200 mg total) by mouth Once a day (Patient not taking: Reported on 08/21/2014)     Social History:    History   Substance Use Topics    Smoking status: Never Smoker     Smokeless tobacco: Not on file    Alcohol Use: 0.0 oz/week     0 Not specified per week      Comment: rare     Family History: No significant family history.  Family History   Problem Relation Age of Onset    Healthy Mother     Healthy Father          Review of Systems:    Musculoskeletal:  L hallux pain with stiffness, pain with ambulation, pain with pressure to L halllux, no discoloration, and no swelling  All other review of systems were negative    Physical Exam:  Vital signs:   Filed Vitals:    08/21/14 1358 08/21/14 1403   BP: 155/111 158/108   Pulse: 84    Temp: 36.7 C (98 F)  TempSrc: Thermal Scan    Resp: 18    Height: 1.676 m (5\' 6" )    Weight: 108.2 kg (238 lb 8.6 oz)    SpO2: 97%      Body mass index is 38.52 kg/(m^2). Normalized weight-for-age data available only for age 4 to 20 years.  No LMP for male patient.    General:  No acute distress  Head:  Atraumatic  Eyes:  Normal lids/lashes and normal conjunctiva  Neck:  supple  Musculoskeletal:  tenderness but no edema or erythema over the plantar aspect of the first metatarsal joint  Skin:  warm/dry and no rash  Psychiatric:  Appropriate affect and behavior  Neurologic:   Alert and oriented    Data Reviewed:    Not applicable    Course: Condition at discharge: Good     Pt referred to Podiatry.    Differential Diagnosis: sesamoiditis v metatarsalga v stress fracture v bipolar disorder     Assessment:   1. Sesamoiditis    2. Bipolar disorder        Plan:    Orders Placed This Encounter    AMB CONSULT/REFERRAL PODIATRY    AMB CONSULT/REFERRAL PSYCHIATRY        Pt should follow up with Podiatry.     Go to Emergency Department immediately for further work up if any concerning  symptoms.  Plan was discussed and patient verbalized understanding.  If symptoms are worsening or not improving the patient should return for further evaluation.    I am scribing for, and in the presence of, Dr. Chriss DriverMark Rogers, MD, for services provided on 08/21/2014.  Cent[employer] CorporationLindsey Schultz     Frederick Schultz 08/21/2014, 14:25

## 2014-11-10 ENCOUNTER — Ambulatory Visit (HOSPITAL_BASED_OUTPATIENT_CLINIC_OR_DEPARTMENT_OTHER): Payer: Medicaid Other | Admitting: PODIATRY

## 2015-12-19 ENCOUNTER — Ambulatory Visit (INDEPENDENT_AMBULATORY_CARE_PROVIDER_SITE_OTHER): Payer: Medicaid Other

## 2015-12-19 ENCOUNTER — Encounter (INDEPENDENT_AMBULATORY_CARE_PROVIDER_SITE_OTHER): Payer: Self-pay

## 2015-12-19 VITALS — BP 162/105 | HR 94 | Temp 99.0°F | Resp 17 | Ht 66.0 in | Wt 220.5 lb

## 2015-12-19 DIAGNOSIS — M79671 Pain in right foot: Secondary | ICD-10-CM

## 2015-12-19 DIAGNOSIS — Z6835 Body mass index (BMI) 35.0-35.9, adult: Secondary | ICD-10-CM

## 2015-12-19 DIAGNOSIS — M779 Enthesopathy, unspecified: Secondary | ICD-10-CM

## 2015-12-19 DIAGNOSIS — I10 Essential (primary) hypertension: Secondary | ICD-10-CM

## 2015-12-19 MED ORDER — NAPROXEN 500 MG TABLET
500.00 mg | ORAL_TABLET | Freq: Two times a day (BID) | ORAL | 1 refills | Status: DC
Start: 2015-12-19 — End: 2016-02-07

## 2015-12-19 NOTE — Progress Notes (Addendum)
Attending Provider: Dr. Edwyna Shellfarjo    Dr. Edwyna Shellfarjo was available for consultation and did not physically see the patient.     History of Present Illness: Frederick Schultz is a 35 y.o. male who presents to the Urgent Care today with chief complaint of    Chief Complaint     Toe Pain           .     Location: right great toe  Quality: stabbing pain with movement, achy pain at rest  Onset: this morning upon awakening  Severity: moderate  Timing: constant  Context: worse with movement, worse with standing, worse with application of shoe  Modifying factors: no improvement with rest and elevation  Associated symptoms: throbbing pain, difficulty with ambulation     I reviewed and confirmed the patient's past medical history taken by the nurse or medical assistant with the addition of the following:    Past Medical History:    Past Medical History:   Diagnosis Date    Bipolar 1 disorder (HCC)          Past Surgical History:    History reviewed. No pertinent surgical history.      Allergies:  Allergies   Allergen Reactions    Asa Buff (Mag Carb-Al Glyc) [Aspirin, Buffered]      Medications:    No current outpatient prescriptions on file.     Social History:    Social History   Substance Use Topics    Smoking status: Current Some Day Smoker    Smokeless tobacco: None    Alcohol use 0.0 oz/week     0 Standard drinks or equivalent per week      Comment: rare     Family History: No significant family history.  Family History   Problem Relation Age of Onset    Healthy Mother     Healthy Father     Heart Attack Father     Hypertension Father     High Cholesterol Father            ROS:  Constitutional: No fever or chills   Skin: No rash or diaphoresis  HENT: No headaches or congestion  Eyes: No vision changes   Cardio: No chest pain  Respiratory: No cough or SOB  GI:  No nausea, vomiting, abdominal pain or stool changes  GU:  No urinary changes  MSK: No back pain, + right great toe pain, no preceding injury  Neuro: No numbness,  tingling, or weakness  All other systems reviewed and are negative.  Physical Exam:    Nursing note and vitals reviewed.    Vitals:    12/19/15 1647 12/19/15 1648   BP: (!) 154/105 (!) 162/105   Pulse: 94    Resp: 17    Temp: 37.2 C (99 F)    TempSrc: Tympanic    SpO2: 96%    Weight: 100 kg (220 lb 7.4 oz)    Height: 1.676 m (5\' 6" )      Body mass index is 35.58 kg/(m^2).    Constitutional: Pt appears well-developed and well-nourished. No acute distress.    Musculoskeletal: Normal range of motion. Tender at the first PIP/MTP joint on the right foot.  ROM remain normal but does cause pain. NO redness appreciated in joint space, no crepitus. No rash or lesion. CMS normal distal to the affected site.     Skin: Skin is warm and dry without rash.  Color normal.      No  LMP for male patient.    Data Reviewed:    Radiography:  No acute fractures, bone cysts, spurs, displacement or gout crystalization noted (as read by Dr. Edwyna Shell.)     Course: Condition at discharge: Stable    Differential Diagnosis: foot fracture vs bunion vs gout vs foot sprain    Assessment:   1. Foot pain, right    2. Tendonitis    3. HTN (hypertension)        Plan:    Orders Placed This Encounter    Right Foot x-ray    CANCELED: XR TOE, 1ST DIGIT RIGHT    CANCELED: Uric Acid    AMB CONSULT/REFERRAL PODIATRY    AMB CONSULT/REFERRAL [facility]    naproxen (NAPROSYN) 500 mg Oral Tablet   RICE therapy. To call [facility] in AM to make same day appt with PCP for HTN.        Go to Emergency Department immediately for further work up if any concerning symptoms.  Plan was discussed and patient verbalized understanding.  If symptoms are worsening or not improving the patient should return to the Urgent Care for further evaluation.      Thereasa Parkin, CFNP  The co-signing faculty was physically present in Urgent Care and available for consultation and did not participate in the care of this patient.

## 2015-12-19 NOTE — Patient Instructions (Signed)
Tendinitis  Tendinitis is swelling and inflammation of the tendons. Tendons are band-like tissues that connect muscle to bone. Tendinitis commonly occurs in the:    Shoulders (rotator cuff).   Heels (Achilles tendon).   Elbows (triceps tendon).  CAUSES  Tendinitis is usually caused by overusing the tendon, muscles, and joints involved. When the tissue surrounding a tendon (synovium) becomes inflamed, it is called tenosynovitis. Tendinitis commonly develops in people whose jobs require repetitive motions.  SYMPTOMS   Pain.   Tenderness.   Mild swelling.  DIAGNOSIS  Tendinitis is usually diagnosed by physical exam. Your health care provider may also order X-rays or other imaging tests.  TREATMENT  Your health care provider may recommend certain medicines or exercises for your treatment.  HOME CARE INSTRUCTIONS    Use a sling or splint for as long as directed by your health care provider until the pain decreases.   Put ice on the injured area.    Put ice in a plastic bag.    Place a towel between your skin and the bag.    Leave the ice on for 15-20 minutes, 3-4 times a day, or as directed by your health care provider.   Avoid using the limb while the tendon is painful. Perform gentle range of motion exercises only as directed by your health care provider. Stop exercises if pain or discomfort increase, unless directed otherwise by your health care provider.   Only take over-the-counter or prescription medicines for pain, discomfort, or fever as directed by your health care provider.  SEEK MEDICAL CARE IF:    Your pain and swelling increase.   You develop new, unexplained symptoms, especially increased numbness in the hands.  MAKE SURE YOU:    Understand these instructions.   Will watch your condition.   Will get help right away if you are not doing well or get worse.     This information is not intended to replace advice given to you by your health care provider. Make sure you discuss any questions you  have with your health care provider.        [facility]      Operated by [facility]  [address] [place], [place] [address]  Phone: [phone]  Fax: [phone]  [email or web address]  Open Daily 8:00a - 8:00p    Closed Thanksgiving and Christmas Day  [facility]     Operated by [facility]  [address]  [facility]  [place], [place] [address]  Phone: [phone])  Fax: [phone]  [email or web address]  Open M-F  8:00a - 8:00p  Sat              10:00a - 4:00p  Sun             Closed  Closed all [place] Holidays        Attending Caregiver: Thereasa Parkin, CFNP      Today's orders:   Orders Placed This Encounter    Right Foot x-ray    CANCELED: XR TOE, 1ST DIGIT RIGHT    Uric Acid    AMB CONSULT/REFERRAL PODIATRY    naproxen (NAPROSYN) 500 mg Oral Tablet        Prescription(s) E-Rx to:  [facility] - [place], [place] - [address] [employer]    ________________________________________________________________________  Short Term Disability and Family Medical Leave Act  [facility] does NOT provide assistance with any disability applications.  If you feel your medical condition requires you to be on disability, you will need to follow up with  your primary care physician or a specialist.  We apologize for any inconvenience.    For Medication Prescribed by [facility]:  As an Urgent Care facility, our clinic does NOT offer prescription refills over the telephone.    If you need more of the medication one of our medical providers prescribed, you will  either need to be re-evaluated by us or see your primary care physician.    ________________________________________________________________________      It is very important that we have a phone number.  This is the single best way to contact you in the event that we become aware of important clinical information or  concerns after your discharge.  If the phone number you provided at registration is NOT this number you should inform staff and registration prior to leaving.      Your treatment and evaluation today was focused on identifying and treating potentially emergent conditions based on your presenting signs, symptoms, and history.  The resulting initial clinical impression and treatment plan is not intended to be definitive or a substitute for a full physical examination and evaluation by your primary care provider.  If your symptoms persist, worsen, or you develop any new or concerning symptoms, you need to be evaluated.      If you received x-rays during your visit, be aware that the final and formal interpretation of those films by a radiologist may occur after your discharge.  If there is a significant discrepancy identified after your discharge, we will contact you at the telephone number provided at registration.      If you received a pelvic exam, you may have cultures pending for sexually transmitted diseases.  Positive cultures are reported to the [facility] Department of Health as required by state law.  You may contact the Health Information Management Office of [facility] to get a copy of your results.     If you are over 35 year old, we cannot discuss your personal health information with a parent, spouse, family member, or anyone else without your consent.  This does not include those who have legitimate access to your records and information to assist in your care under the provisions of HIPAA [facility](Health Insurance Portability and Accountability Act) law, or those to whom you have previously given written consent to do so, such a legal guardian or Power of [place].      Instructions are discussed with patient upon discharge by clinical staff with all questions answered.  Please call [facility] [phone]) if any further questions develop.  Go immediately to the emergency department if any concerns  or worsening symptoms.      Thereasa Parkinlizabeth Alize Borrayo, WisconsinCFNP 12/19/2015, 17:53      Document Released: 08/24/2000 Document Revised: 09/17/2014 Document Reviewed: 11/13/2010  Elsevier Interactive Patient Education [employer]vier Inc.

## 2015-12-19 NOTE — Addendum Note (Signed)
Addended by: Thereasa ParkinMINCHAU, Addilynne Olheiser on: 12/19/2015 06:03 PM     Modules accepted: Orders

## 2016-01-13 ENCOUNTER — Ambulatory Visit (HOSPITAL_COMMUNITY): Payer: Self-pay

## 2016-01-13 NOTE — Telephone Encounter (Signed)
Regarding: Waitlist for med management  ----- Message from Nigel Bridgemanynthia Jo Hayes sent at 01/13/2016  2:46 PM EDT -----  Hello    Pt is wanting to be seen for medication management.  Advised of wait list.      Thank you    Arline AspCindy

## 2016-01-13 NOTE — Telephone Encounter (Signed)
Pt has been added to Epic waitlist for NPV appointment. Letter was sent informing the patient he is now in the wait list for psychiatry   Arcola JanskyOlivia Deonna Brodi Kari, CASE MANAGER  01/13/2016, 14:52

## 2016-01-17 ENCOUNTER — Ambulatory Visit (HOSPITAL_BASED_OUTPATIENT_CLINIC_OR_DEPARTMENT_OTHER): Payer: Medicaid Other | Admitting: Hospitalist

## 2016-02-07 ENCOUNTER — Encounter (HOSPITAL_BASED_OUTPATIENT_CLINIC_OR_DEPARTMENT_OTHER): Payer: Self-pay | Admitting: Hospitalist

## 2016-02-07 ENCOUNTER — Ambulatory Visit: Payer: Medicaid Other | Attending: Family Medicine | Admitting: Hospitalist

## 2016-02-07 VITALS — BP 168/110 | HR 88 | Temp 97.3°F | Ht 65.98 in | Wt 218.5 lb

## 2016-02-07 DIAGNOSIS — F419 Anxiety disorder, unspecified: Secondary | ICD-10-CM | POA: Insufficient documentation

## 2016-02-07 DIAGNOSIS — F319 Bipolar disorder, unspecified: Secondary | ICD-10-CM | POA: Insufficient documentation

## 2016-02-07 DIAGNOSIS — J45909 Unspecified asthma, uncomplicated: Secondary | ICD-10-CM | POA: Insufficient documentation

## 2016-02-07 DIAGNOSIS — Z6835 Body mass index (BMI) 35.0-35.9, adult: Secondary | ICD-10-CM

## 2016-02-07 DIAGNOSIS — I10 Essential (primary) hypertension: Secondary | ICD-10-CM | POA: Insufficient documentation

## 2016-02-07 MED ORDER — HYDROCHLOROTHIAZIDE 12.5 MG CAPSULE
12.5000 mg | ORAL_CAPSULE | Freq: Every day | ORAL | 4 refills | Status: DC
Start: 2016-02-07 — End: 2016-02-17

## 2016-02-07 MED ORDER — HYDROCHLOROTHIAZIDE 25 MG TABLET
25.0000 mg | ORAL_TABLET | Freq: Every day | ORAL | 4 refills | Status: DC
Start: 2016-02-07 — End: 2016-02-07

## 2016-02-07 NOTE — Progress Notes (Signed)
Department of Family Medicine   Progress Note    Frederick Schultz  MRN: [id number]  DOB: 03/14/1981  Date of Service: 02/07/2016    CHIEF COMPLAINT  Chief Complaint   Patient presents with    Physical    Elevated Blood Pressure       SUBJECTIVE  Frederick Schultz is a 35 y.o. male who presents to clinic for HTN. He states he has struggled with HTN in the past but placed on metoprolol due dual effect with anxiety. He currently denies any medications at this time. He admits to poor diet and high sodium containing diet. He denies exercise regimen. He has FHx of CAD from father. He denies any blurry vision, SOB, chest pain, or BLE swelling.     PMH only significant for Bipolar and asthma (not on medications). He was previously on Lithium with great success. He states he is pending [facility] appt. Mood stable. He denies any SI/HI/AH/VH.     Review of Systems:  Constitutional: negative for fevers, chills  Eyes: negative for vision changes  Ears, nose, mouth, throat, and face: negative for earaches, nasal congestion, sore throat  Respiratory: negative for cough, shortness of breath  Cardiovascular: negative for chest pain, palpitations  Gastrointestinal: negative for reflux symptoms, nausea, vomiting, diarrhea, constipation  Genitourinary: negative for dysuria  Integument: negative for rash  Musculoskeletal: negative for joint pain  Neurological: negative for headaches, numbness/tingling  Behavioral/Psych: negative for anxiety and depression  Endocrine: negative for excessive thirst, excessive urination    Medications:     Outpatient Medications Prior to Visit:  naproxen (NAPROSYN) 500 mg Oral Tablet Take 1 Tab (500 mg total) by mouth Twice daily with food (Patient not taking: Reported on 02/07/2016)     No facility-administered medications prior to visit.     Allergies:   Allergies   Allergen Reactions    Asa Buff (Mag Carb-Al Glyc) [Aspirin, Buffered]          OBJECTIVE  BP (!) 168/110   Pulse 88   Temp 36.3 C (97.3 F) (Thermal  Scan)    Ht 1.676 m (5' 5.98")   Wt 99.1 kg (218 lb 7.6 oz)   SpO2 98%   BMI 35.28 kg/m2  General: no distress  HENT: TMs clear, mouth mucous membranes moist, pharynx without injection or exudate   Lungs: clear to auscultation bilaterally  Cardiovascular: RRR, no murmur  Abdomen: soft, non tender, bowel sounds present  Extremities: no cyanosis or edema  Skin: warm and dry, no rash  Neurologic: gait is normal, AOx3, CN 2-12 grossly intact  Psychiatric: normal affect and behavior    ASSESSMENT/PLAN  1. HTN (hypertension)  - Discussed diet modification and maintaining low sodium diet  - Will check BP at OF locations and document these readings   - BMP ordered today for renal function  - Started on HCTZ 12.5mg  daily   - Will RTC in 4 weeks to check BP and discuss labs     2. Bipolar 1 disorder (HCC)  - Mood stable  - [facility] referral pending      Orders Placed This Encounter    BMP ( non-fasting)    hydroCHLOROthiazide (MICROZIDE) 12.5 mg Oral Capsule         Return in about 4 weeks (around 03/06/2016).      Arlie SolomonsMonica Marie Cerone, MD 02/07/2016, 12:01      I discussed the patient's care with the resident prior to the patient leaving the clinic. Any significant discussion  points are noted.    Joseph Art Bridgitt Raggio, MD 02/15/2016, 10:20

## 2016-02-17 ENCOUNTER — Ambulatory Visit (HOSPITAL_BASED_OUTPATIENT_CLINIC_OR_DEPARTMENT_OTHER): Payer: Medicaid Other | Admitting: Family Medicine

## 2016-02-17 ENCOUNTER — Ambulatory Visit (HOSPITAL_BASED_OUTPATIENT_CLINIC_OR_DEPARTMENT_OTHER): Payer: Medicaid Other

## 2016-02-17 ENCOUNTER — Encounter (HOSPITAL_BASED_OUTPATIENT_CLINIC_OR_DEPARTMENT_OTHER): Payer: Self-pay | Admitting: Family Medicine

## 2016-02-17 ENCOUNTER — Ambulatory Visit
Admission: RE | Admit: 2016-02-17 | Discharge: 2016-02-17 | Disposition: A | Discharge status: Home / Self Care | Payer: Medicaid Other | Source: Ambulatory Visit | Attending: Family Medicine | Admitting: Family Medicine

## 2016-02-17 VITALS — BP 142/102 | HR 78 | Temp 97.5°F | Ht 65.98 in | Wt 215.8 lb

## 2016-02-17 DIAGNOSIS — R0789 Other chest pain: Secondary | ICD-10-CM

## 2016-02-17 DIAGNOSIS — I10 Essential (primary) hypertension: Secondary | ICD-10-CM

## 2016-02-17 DIAGNOSIS — Z79899 Other long term (current) drug therapy: Secondary | ICD-10-CM | POA: Insufficient documentation

## 2016-02-17 DIAGNOSIS — Z6834 Body mass index (BMI) 34.0-34.9, adult: Secondary | ICD-10-CM

## 2016-02-17 LAB — COMPREHENSIVE METABOLIC PNL, FASTING
ALBUMIN: 4.3 g/dL (ref 3.5–5.0)
ALKALINE PHOSPHATASE: 73 U/L (ref <150)
ALT (SGPT): 29 U/L (ref <55)
ANION GAP: 8 mmol/L (ref 4–13)
AST (SGOT): 19 U/L (ref 8–48)
BILIRUBIN TOTAL: 2.1 mg/dL — ABNORMAL HIGH (ref 0.3–1.3)
BILIRUBIN TOTAL: 2.1 mg/dL — ABNORMAL HIGH (ref 0.3–1.3)
BUN/CREA RATIO: 19 (ref 6–22)
BUN: 26 mg/dL — ABNORMAL HIGH (ref 8–25)
CALCIUM: 9.5 mg/dL (ref 8.5–10.4)
CHLORIDE: 106 mmol/L (ref 96–111)
CO2 TOTAL: 27 mmol/L (ref 22–32)
CREATININE: 1.35 mg/dL — ABNORMAL HIGH (ref 0.62–1.27)
ESTIMATED GFR: >59 mL/min/1.73m?2 (ref >59)
GLUCOSE: 98 mg/dL (ref 70–105)
POTASSIUM: 3.9 mmol/L (ref 3.5–5.1)
PROTEIN TOTAL: 6.9 g/dL (ref 6.4–8.3)
SODIUM: 141 mmol/L (ref 136–145)

## 2016-02-17 LAB — LIPID PANEL
CHOL/HDL RATIO: 3.9
CHOLESTEROL: 124 mg/dL (ref <200)
HDL CHOL: 32 mg/dL — ABNORMAL LOW (ref >=39)
LDL CALC: 66 mg/dL (ref <=100)
NON-HDL: 92 mg/dL (ref <=190)
TRIGLYCERIDES: 128 mg/dL (ref <=150)
VLDL CALC: 26 mg/dL (ref <30)

## 2016-02-17 LAB — ECG 12-LEAD (UTC ONLY)
Atrial Rate: 76 {beats}/min
Calculated P Axis: 18 degrees
PR Interval: 142 ms
QRS Duration: 86 ms
QT Interval: 368 ms
QTC Calculation: 414 ms
Ventricular rate: 76 {beats}/min

## 2016-02-17 LAB — CBC
HCT: 51.1 % — ABNORMAL HIGH (ref 36.7–47.0)
HGB: 17.5 g/dL — ABNORMAL HIGH (ref 12.5–16.3)
MCH: 30.1 pg (ref 27.4–33.0)
MCHC: 34.2 g/dL (ref 32.5–35.8)
MCV: 87.8 fL (ref 78.0–100.0)
MPV: 10.2 fL (ref 7.5–11.5)
PLATELETS: 192 x10ˆ3/uL (ref 140–450)
RBC: 5.82 10*6/uL — ABNORMAL HIGH (ref 4.06–5.63)
RBC: 5.82 x10ˆ6/uL — ABNORMAL HIGH (ref 4.06–5.63)
RDW: 12.7 % (ref 12.0–15.0)
WBC: 8.8 x10ˆ3/uL (ref 3.5–11.0)

## 2016-02-17 LAB — THYROID STIMULATING HORMONE WITH FREE T4 REFLEX: TSH: 1.016 u[IU]/mL (ref 0.350–5.000)

## 2016-02-17 MED ORDER — LISINOPRIL 10 MG TABLET
10.0000 mg | ORAL_TABLET | Freq: Every day | ORAL | 1 refills | Status: DC
Start: 2016-02-17 — End: 2016-03-16

## 2016-02-17 NOTE — Progress Notes (Signed)
Subjective:     Patient ID:  Frederick Schultz is an 35 y.o. male   Chief Complaint:    Chief Complaint   Patient presents with    Hypertension       HPI   Frederick Schultz is here today because he thinks he is having a reaction the the HCTZ which was started about 10 days ago. For the past 3-4 days he has has a constant dull ache to his chest. He notices it is worse with certain movement.s The pain is a dull ache and feels like his muscle is bruised or warn out and it is the right or left side of his chest. Denies and SOB. Pain not any worsen with activity. Pain level 2/10. Denies any nausea or worsening of pain related to eating. Denies abd pain. He has some anxiety related to the chest discomfort and his heart. Occasional he does feel a flutter or "twinge" in his chest he related to the pain.     He has not gotten a BP cuff yet to check at home. He has not had the blood at this time.     His dad had MI in his 8's     Review of Systems   Constitutional: Negative.    HENT: Negative.    Eyes: Negative.    Respiratory: Negative.    Cardiovascular: Positive for chest pain and palpitations. Negative for leg swelling.   Gastrointestinal: Negative.    Musculoskeletal: Negative.    Skin: Negative.      Objective:   Physical Exam   Constitutional: He is oriented to person, place, and time. He appears well-developed and well-nourished.   HENT:   Head: Normocephalic and atraumatic.   Eyes: Conjunctivae are normal.   Cardiovascular: Normal rate, regular rhythm and normal heart sounds.    Pulmonary/Chest: Effort normal and breath sounds normal. He has no wheezes. He exhibits no tenderness.   Abdominal: Soft. Bowel sounds are normal. He exhibits no distension. There is no tenderness.   Neurological: He is alert and oriented to person, place, and time.   Skin: Skin is warm and dry.   Psychiatric: He has a normal mood and affect. His behavior is normal.     Ortho Exam     BP (!) 142/102   Pulse 78   Temp 36.4 C (97.5 F)   Ht 1.676 m (5'  5.98")   Wt 97.9 kg (215 lb 13.3 oz)   SpO2 99%   BMI 34.85 kg/m2   144/100    Past Medical History:   Diagnosis Date    Asthma     Bipolar 1 disorder (HCC)      Current Outpatient Prescriptions   Medication Sig    hydroCHLOROthiazide (MICROZIDE) 12.5 mg Oral Capsule Take 1 Cap (12.5 mg total) by mouth Once a day     Allergies   Allergen Reactions    Asa Buff (Mag Carb-Al Glyc) [Aspirin, Buffered]        Assessment & Plan:     (R07.89) Chest discomfort  (primary encounter diagnosis)  (I10) HTN (hypertension)  Plan: ECG 12-LEAD (UTC ONLY)  Patient having chest discomfort after starting the HCTZ. EKG completed in the office and reviewed with MD.   Will have patient stop the HCTZ and will start him on lisinopril. Rx called into pharmacy.  I called and spoke with the pharmacy about buffered asa allergy and lisinopril and she did not see any interactions.   CXR, labs and urine ordered  for today.  Patient is advised to get a home blood pressure cuff to monitor his BP at home.   He is advised on worrisome sx and when to go to the ED.   F/u with PCP in 1 month.  Louie Bunhristine Snyder, APRN    Dr Mayford KnifeWilliams in to exam the patient and discuss plan of care.  Louie Bunhristine Snyder, APRN  02/17/2016, 11:24

## 2016-02-17 NOTE — Progress Notes (Signed)
02/17/16 1100   Urine test  (Siemens Multistix 10 SG)   Time collected 1115   Color Yellow   Clarity Clear   Glucose Negative   Bilirubin Negative   Ketones Negative   Urine Specific Gravity 1.030   Blood (urine) Negative   pH 7.0   Protein Negative   Urobilinogen Normal    Nitrite Negative   Leukocytes Negative   Bottle Number   (Siemens Multistix 10 SG) [id number]   Lot # [id number]   Expiration Date 06/09/16   Doristine MangoInitials S Sulaiman Imbert RN

## 2016-02-22 ENCOUNTER — Telehealth (HOSPITAL_BASED_OUTPATIENT_CLINIC_OR_DEPARTMENT_OTHER): Payer: Self-pay | Admitting: Family Medicine

## 2016-02-22 DIAGNOSIS — I10 Essential (primary) hypertension: Secondary | ICD-10-CM

## 2016-02-22 NOTE — Telephone Encounter (Signed)
Attempted to call patient with results no answer and no voice mail.    Patients CXR was negative. His labs did she his kidney function was elevated which may be from the HCTZ medication he was on for his BP. Order placed for him to have this rechecked in 2 weeks since we switched the medication.

## 2016-02-23 NOTE — Telephone Encounter (Signed)
Message left for patient to call the office.

## 2016-02-23 NOTE — Telephone Encounter (Signed)
Patient returned call to clinic. Notified   Lab Results   Component Value Date    CREATININE 1.35 (H) 02/17/2016    CREATININE 1.17 09/01/2008     Patient was encouraged to Push PO fluid and repeat blood work in 2 weeks. Patient notified Elevated CR could be related to BP medication HCTZ however NP would like follow up. Patient would like to discuss this further.   Josephine CablesMeagan E Rhiannon Sassaman, RN

## 2016-02-23 NOTE — Telephone Encounter (Signed)
I called and spoke with the patient he was concerned with how serious the problem was. I reviewed his labs and the normal values with patient. I reassured him that is mildly elevated which can happen with the HCTZ. Encouraged fluids and advised him to have it repeated in 2 weeks. Patient verbalizes understanding. He is to keep his f/u appt

## 2016-03-02 ENCOUNTER — Encounter (HOSPITAL_BASED_OUTPATIENT_CLINIC_OR_DEPARTMENT_OTHER): Payer: Medicaid Other | Admitting: Hospitalist

## 2016-03-05 ENCOUNTER — Ambulatory Visit: Payer: Medicaid Other | Attending: Family Medicine

## 2016-03-05 DIAGNOSIS — I10 Essential (primary) hypertension: Secondary | ICD-10-CM | POA: Insufficient documentation

## 2016-03-05 LAB — COMPREHENSIVE METABOLIC PANEL, NON-FASTING
ALBUMIN: 4 g/dL (ref 3.5–5.0)
ALKALINE PHOSPHATASE: 66 U/L (ref <150)
ALT (SGPT): 29 U/L (ref <55)
ANION GAP: 5 mmol/L (ref 4–13)
AST (SGOT): 20 U/L (ref 8–48)
BILIRUBIN TOTAL: 1.4 mg/dL — ABNORMAL HIGH (ref 0.3–1.3)
BUN/CREA RATIO: 17 (ref 6–22)
BUN: 21 mg/dL (ref 8–25)
CALCIUM: 9 mg/dL (ref 8.5–10.2)
CHLORIDE: 109 mmol/L (ref 96–111)
CO2 TOTAL: 26 mmol/L (ref 22–32)
CREATININE: 1.23 mg/dL (ref 0.62–1.27)
ESTIMATED GFR: >59 mL/min/1.73mˆ2 (ref >59)
GLUCOSE: 99 mg/dL (ref 65–139)
POTASSIUM: 4.3 mmol/L (ref 3.5–5.1)
PROTEIN TOTAL: 6.4 g/dL (ref 6.4–8.3)
SODIUM: 140 mmol/L (ref 136–145)

## 2016-03-09 ENCOUNTER — Encounter (HOSPITAL_BASED_OUTPATIENT_CLINIC_OR_DEPARTMENT_OTHER): Payer: Medicaid Other | Admitting: Hospitalist

## 2016-03-16 ENCOUNTER — Encounter (HOSPITAL_BASED_OUTPATIENT_CLINIC_OR_DEPARTMENT_OTHER): Payer: Self-pay | Admitting: Family Medicine

## 2016-03-16 ENCOUNTER — Ambulatory Visit: Payer: Medicaid Other | Attending: Family Medicine | Admitting: Family Medicine

## 2016-03-16 VITALS — BP 132/90 | HR 85 | Temp 97.2°F | Ht 65.98 in | Wt 216.1 lb

## 2016-03-16 DIAGNOSIS — Z6834 Body mass index (BMI) 34.0-34.9, adult: Secondary | ICD-10-CM

## 2016-03-16 DIAGNOSIS — I10 Essential (primary) hypertension: Principal | ICD-10-CM | POA: Insufficient documentation

## 2016-03-16 DIAGNOSIS — Z79899 Other long term (current) drug therapy: Secondary | ICD-10-CM | POA: Insufficient documentation

## 2016-03-16 MED ORDER — LISINOPRIL 10 MG TABLET
10.0000 mg | ORAL_TABLET | Freq: Every day | ORAL | 5 refills | Status: DC
Start: 2016-03-16 — End: 2016-06-07

## 2016-03-16 NOTE — Progress Notes (Signed)
Subjective:     Patient ID:  Frederick Schultz is an 35 y.o. male   Chief Complaint:    Chief Complaint   Patient presents with    Hypertension       HPI   Gregary SignsSean is here today to follow up with his blood pressure. He had started on lisinopril at his last visit. He has been watching his salt and diet. He has not been checking his BP at home. He is no longer having any chest pain. He denies any problems with the new medication. He has been increasing his water intake. Denies headaches, SOB or dizziness.     Review of Systems   Constitutional: Negative.    HENT: Negative.    Respiratory: Negative.    Cardiovascular: Negative.    Gastrointestinal: Negative.    Genitourinary: Negative.    Neurological: Negative.      Objective:   Physical Exam   Constitutional: He is oriented to person, place, and time. He appears well-developed and well-nourished.   HENT:   Head: Normocephalic and atraumatic.   Eyes: Conjunctivae are normal.   Cardiovascular: Normal rate, regular rhythm and normal heart sounds.    Pulmonary/Chest: Breath sounds normal. No respiratory distress.   Abdominal: Soft. Bowel sounds are normal. He exhibits distension.   Neurological: He is alert and oriented to person, place, and time.   Skin: Skin is warm and dry.   Psychiatric: He has a normal mood and affect. His behavior is normal.     Ortho Exam     BP 132/90   Pulse 85   Temp 36.2 C (97.2 F) (Thermal Scan)    Ht 1.676 m (5' 5.98")   Wt 98 kg (216 lb 0.8 oz)   BMI 34.89 kg/m2   130/90    Current Outpatient Prescriptions   Medication Sig    lisinopril (PRINIVIL) 10 mg Oral Tablet Take 1 Tab (10 mg total) by mouth Once a day     Past Medical History:   Diagnosis Date    Asthma     Bipolar 1 disorder (HCC)      Allergies   Allergen Reactions    Asa Buff (Mag Carb-Al Glyc) [Aspirin, Buffered]          Assessment & Plan:     (I10) HTN (hypertension)  (primary encounter diagnosis)  Plan: Will continue with lisinopril.  Continue to watch salt intake.  Decrease  caffeine intake.   Increase activity.  Advised to check BP at local pharmacy to have a couple of home numbers for next visit.  Discussed kidney function which improved with changed medication.  F/u in 2 months.  Frederick Bunhristine Snyder, APRN    The patient was seen independently with the co-signing faculty present in clinic.  Frederick Bunhristine Snyder, APRN  03/16/2016, 09:59

## 2016-04-10 ENCOUNTER — Ambulatory Visit: Payer: Medicaid Other | Attending: Family Medicine | Admitting: Family Medicine

## 2016-04-10 ENCOUNTER — Encounter (HOSPITAL_BASED_OUTPATIENT_CLINIC_OR_DEPARTMENT_OTHER): Payer: Self-pay | Admitting: Family Medicine

## 2016-04-10 VITALS — BP 118/80 | HR 78 | Temp 97.9°F | Ht 66.0 in | Wt 209.9 lb

## 2016-04-10 DIAGNOSIS — N5089 Other specified disorders of the male genital organs: Secondary | ICD-10-CM

## 2016-04-10 DIAGNOSIS — Z6833 Body mass index (BMI) 33.0-33.9, adult: Secondary | ICD-10-CM

## 2016-04-10 DIAGNOSIS — N509 Disorder of male genital organs, unspecified: Secondary | ICD-10-CM | POA: Insufficient documentation

## 2016-04-10 DIAGNOSIS — Z23 Encounter for immunization: Secondary | ICD-10-CM | POA: Insufficient documentation

## 2016-04-10 DIAGNOSIS — Z79899 Other long term (current) drug therapy: Secondary | ICD-10-CM | POA: Insufficient documentation

## 2016-04-10 DIAGNOSIS — I10 Essential (primary) hypertension: Secondary | ICD-10-CM | POA: Insufficient documentation

## 2016-04-10 DIAGNOSIS — Z0189 Encounter for other specified special examinations: Secondary | ICD-10-CM | POA: Insufficient documentation
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
## 2016-04-10 NOTE — Progress Notes (Signed)
Patient received a Tdap in clinic. Tolerated well, not bleeding noted when patient left. See Flow Sheet.  Immunization administered     Name Date Dose VIS Date Route    Tetanus Toxoid/Diphtheria Toxoid/Acellular Pertussis Vaccine, Adsorbed (Tdap) 04/10/2016 0.5 mL 11/03/2013 Intramuscular    Site: Left deltoid    Given By: Laury Deep, LPN    Manufacturer: Gl[employer]oSmith[employer]    Lot: [id number]    NDC: [id number]        Laury Deep, LPN

## 2016-04-10 NOTE — Patient Instructions (Signed)
Diphtheria/Tetanus Toxoids; Pertussis Vaccine, DTP injection  What is this medicine?  DIPHTHERIA and TETANUS TOXOIDS; PERTUSSIS VACCINE (dif THEER ee uh and TET n us TOK soids; per TUS iss VAK seen) is used to prevent diphtheria, tetanus, and pertussis infections.  How should I use this medicine?  This vaccine is for injection into a muscle. It is given by a health care professional.  A copy of Vaccine Information Statements will be given before each vaccination. Read this sheet carefully each time. The sheet may change frequently.  Talk to your pediatrician regarding the use of this vaccine in children. While the DTP vaccine may be given to children ages 6 weeks to 7 years and the Tdap vaccine may be given to children at least 10 years old, precautions do apply.  What side effects may I notice from receiving this medicine?  Side effects that you should report to your doctor or health care professional as soon as possible:   allergic reactions like skin rash, itching or hives, swelling of the face, lips, or tongue   breathing problems   fever of 103 degrees F or more   flu-like symptoms   inconsolable crying   infection   pain, tingling, numbness in the hands or feet   seizures   swelling of arm or leg that was injected   unusually weak or tired  Side effects that usually do not require immediate medical attention (report these side effects to your doctor or health care professional if they continue or are bothersome):   fussy, irritable   loss of appetite   fever of 102 degrees F or less   pain, tenderness, redness, swelling, or a 'knot' at site where injected   vomiting  What may interact with this medicine?   immune globulin   medicines that suppress your immune function like adalimumab, anakinra, infliximab   medicines to treat cancer   medicines that treat or prevent blood clots like warfarin, enoxaparin, and dalteparin   steroid medicines like prednisone or cortisone  What if I miss a  dose?  It is important not to miss your dose. Call your doctor or health care professional if you are unable to keep an appointment.  Where should I keep my medicine?  This drug is given in a hospital or clinic and will not be stored at home.  What should I tell my health care provider before I take this medicine?  They need to know if you have any of these conditions:   blood disorders like hemophilia   fever or infection   immune system problems   neurologic disease   seizures   an unusual or allergic reaction to vaccines, thimerosal, latex, other medicines, foods, dyes, or preservatives   pregnant or trying to get pregnant   breast-feeding  What should I watch for while using this medicine?  See your health care provider for all shots of this vaccine as directed. To have protection from infection, you must have 3 shots of this vaccine plus boosters as needed. Tell your doctor right away if you have any serious or unusual side effects after getting this vaccine.  NOTE:This sheet is a summary. It may not cover all possible information. If you have questions about this medicine, talk to your doctor, pharmacist, or health care provider. Copyright 2017 [employer]

## 2016-04-11 ENCOUNTER — Ambulatory Visit
Admission: RE | Admit: 2016-04-11 | Discharge: 2016-04-11 | Disposition: A | Discharge status: Home / Self Care | Payer: Medicaid Other | Source: Ambulatory Visit | Attending: Family Medicine | Admitting: Family Medicine

## 2016-04-11 ENCOUNTER — Ambulatory Visit (HOSPITAL_COMMUNITY): Payer: Self-pay

## 2016-04-11 DIAGNOSIS — N5089 Other specified disorders of the male genital organs: Secondary | ICD-10-CM

## 2016-04-11 DIAGNOSIS — N509 Disorder of male genital organs, unspecified: Secondary | ICD-10-CM | POA: Insufficient documentation

## 2016-04-11 NOTE — Telephone Encounter (Signed)
Case manager returned call and left message with direct call back number for questions/ concerns    Arcola Jansky, CASE MANAGER  04/11/2016, 16:36

## 2016-04-11 NOTE — Telephone Encounter (Signed)
-----   Message from Christene Slates sent at 04/11/2016  3:37 PM EDT -----  Pt is calling to check on the status of his appointment. States he as been on the wait list since May. Please advise at [phone] thank you!

## 2016-04-16 ENCOUNTER — Ambulatory Visit (HOSPITAL_BASED_OUTPATIENT_CLINIC_OR_DEPARTMENT_OTHER): Payer: Medicaid Other

## 2016-04-16 ENCOUNTER — Encounter (HOSPITAL_BASED_OUTPATIENT_CLINIC_OR_DEPARTMENT_OTHER): Payer: Self-pay | Admitting: Urology

## 2016-04-16 ENCOUNTER — Ambulatory Visit: Payer: Medicaid Other | Attending: Urology | Admitting: Urology

## 2016-04-16 DIAGNOSIS — F319 Bipolar disorder, unspecified: Secondary | ICD-10-CM | POA: Insufficient documentation

## 2016-04-16 DIAGNOSIS — Z8249 Family history of ischemic heart disease and other diseases of the circulatory system: Secondary | ICD-10-CM | POA: Insufficient documentation

## 2016-04-16 DIAGNOSIS — Z87891 Personal history of nicotine dependence: Secondary | ICD-10-CM | POA: Insufficient documentation

## 2016-04-16 DIAGNOSIS — N5089 Other specified disorders of the male genital organs: Secondary | ICD-10-CM

## 2016-04-16 DIAGNOSIS — N509 Disorder of male genital organs, unspecified: Secondary | ICD-10-CM | POA: Insufficient documentation

## 2016-04-16 DIAGNOSIS — I10 Essential (primary) hypertension: Secondary | ICD-10-CM | POA: Insufficient documentation

## 2016-04-16 DIAGNOSIS — J45909 Unspecified asthma, uncomplicated: Secondary | ICD-10-CM | POA: Insufficient documentation
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
## 2016-06-06 ENCOUNTER — Ambulatory Visit: Payer: Medicaid Other | Attending: Family Medicine | Admitting: Family Medicine

## 2016-06-06 ENCOUNTER — Encounter (HOSPITAL_BASED_OUTPATIENT_CLINIC_OR_DEPARTMENT_OTHER): Payer: Self-pay | Admitting: Family Medicine

## 2016-06-06 ENCOUNTER — Ambulatory Visit (HOSPITAL_BASED_OUTPATIENT_CLINIC_OR_DEPARTMENT_OTHER): Payer: Medicaid Other

## 2016-06-06 VITALS — BP 132/85 | HR 83 | Temp 97.5°F | Ht 65.98 in | Wt 213.0 lb

## 2016-06-06 DIAGNOSIS — Z6834 Body mass index (BMI) 34.0-34.9, adult: Secondary | ICD-10-CM

## 2016-06-06 DIAGNOSIS — Z79899 Other long term (current) drug therapy: Secondary | ICD-10-CM | POA: Insufficient documentation

## 2016-06-06 DIAGNOSIS — R05 Cough: Secondary | ICD-10-CM | POA: Insufficient documentation

## 2016-06-06 DIAGNOSIS — F319 Bipolar disorder, unspecified: Secondary | ICD-10-CM | POA: Insufficient documentation

## 2016-06-06 DIAGNOSIS — I10 Essential (primary) hypertension: Secondary | ICD-10-CM

## 2016-06-06 LAB — ALT (SGPT): ALT (SGPT): 27 U/L (ref <55)

## 2016-06-06 LAB — BASIC METABOLIC PANEL
ANION GAP: 8 mmol/L (ref 4–13)
BUN/CREA RATIO: 17 (ref 6–22)
BUN: 19 mg/dL (ref 8–25)
CALCIUM: 9 mg/dL (ref 8.5–10.2)
CHLORIDE: 107 mmol/L (ref 96–111)
CO2 TOTAL: 25 mmol/L (ref 22–32)
CREATININE: 1.12 mg/dL (ref 0.62–1.27)
ESTIMATED GFR: >59 mL/min/1.73mˆ2 (ref >59)
GLUCOSE: 95 mg/dL (ref 65–139)
POTASSIUM: 4.2 mmol/L (ref 3.5–5.1)
SODIUM: 140 mmol/L (ref 136–145)

## 2016-06-06 MED ORDER — AMLODIPINE 5 MG TABLET
5.0000 mg | ORAL_TABLET | Freq: Every day | ORAL | 4 refills | Status: DC
Start: 2016-06-06 — End: 2016-08-28

## 2016-06-07 ENCOUNTER — Encounter (HOSPITAL_BASED_OUTPATIENT_CLINIC_OR_DEPARTMENT_OTHER): Payer: Self-pay

## 2016-06-07 ENCOUNTER — Ambulatory Visit
Payer: Medicaid Other | Attending: Family Medicine | Admitting: Student in an Organized Health Care Education/Training Program

## 2016-06-07 VITALS — BP 123/93 | HR 71 | Temp 96.8°F | Ht 66.0 in | Wt 212.3 lb

## 2016-06-07 DIAGNOSIS — Z6834 Body mass index (BMI) 34.0-34.9, adult: Secondary | ICD-10-CM

## 2016-06-07 DIAGNOSIS — F411 Generalized anxiety disorder: Secondary | ICD-10-CM | POA: Insufficient documentation

## 2016-06-07 DIAGNOSIS — F331 Major depressive disorder, recurrent, moderate: Secondary | ICD-10-CM

## 2016-06-07 DIAGNOSIS — Z79899 Other long term (current) drug therapy: Secondary | ICD-10-CM | POA: Insufficient documentation

## 2016-06-07 DIAGNOSIS — IMO0002 Reserved for concepts with insufficient information to code with codable children: Secondary | ICD-10-CM

## 2016-06-07 DIAGNOSIS — F1099 Alcohol use, unspecified with unspecified alcohol-induced disorder: Secondary | ICD-10-CM

## 2016-06-07 DIAGNOSIS — Z7289 Other problems related to lifestyle: Secondary | ICD-10-CM | POA: Insufficient documentation

## 2016-06-07 MED ORDER — SERTRALINE 50 MG TABLET
50.0000 mg | ORAL_TABLET | Freq: Every day | ORAL | 1 refills | Status: DC
Start: 2016-06-07 — End: 2016-07-24

## 2016-06-07 MED ORDER — ACAMPROSATE 333 MG TABLET,DELAYED RELEASE
666.00 mg | DELAYED_RELEASE_TABLET | Freq: Three times a day (TID) | ORAL | 1 refills | Status: DC
Start: 2016-06-07 — End: 2018-05-13

## 2016-06-07 NOTE — Progress Notes (Signed)
Subjective:     Patient ID:  Frederick Schultz is an 35 y.o. male   Chief Complaint:    Chief Complaint   Patient presents with    New Patient     needing to speak with a pychiatrist regarding plan of care and medication       HPI   This is a patient presenting [facility] for treatment of psych illness. He says he has a history of bipolar d/o that was treated with lithium and Zoloft until a few years ago when he lost his health insurance. He says she has experienced manic episodes in the past; however, denies a period of expansive mood, excessive spending, decreased need for sleep, or delusions of grandeur. He says he is depressed the majority of the time and is also very anxious. He endorses increased need for sleep, anhedonia, feelings of shame, low energy, decreased concentration. He denies change in appetite or suicidal ideation. He also reports having recurrent thoughts of his impending doom. He feels daily that he will die soon and worry's constantly about death. He knows this is irrational. He says he has had these symptoms for many years.     Denies SI/HI or AVH.   Review of Systems   Constitutional: Positive for fatigue. Negative for chills and fever.   Respiratory: Negative for cough, chest tightness, shortness of breath and wheezing.    Cardiovascular: Negative for chest pain, palpitations and leg swelling.   Gastrointestinal: Negative for abdominal distention, abdominal pain, constipation, diarrhea, nausea and vomiting.   Genitourinary: Negative for difficulty urinating, dysuria and flank pain.   Musculoskeletal: Negative for arthralgias and back pain.   Psychiatric/Behavioral: Positive for agitation, decreased concentration, dysphoric mood and sleep disturbance. Negative for behavioral problems, confusion, hallucinations, self-injury and suicidal ideas. The patient is nervous/anxious. The patient is not hyperactive.      Objective:  BP (!) 123/93   Pulse 71   Temp 36 C (96.8 F) (Thermal  Scan)    Ht 1.676 m (5\' 6" )   Wt 96.3 kg (212 lb 4.9 oz)   SpO2 98%   BMI 34.27 kg/m2     Physical Exam   Constitutional: He is oriented to person, place, and time. He appears well-developed and well-nourished. No distress.   HENT:   Head: Normocephalic and atraumatic.   Right Ear: External ear normal.   Left Ear: External ear normal.   Eyes: Conjunctivae and EOM are normal. Pupils are equal, round, and reactive to light.   Cardiovascular: Normal rate, regular rhythm, normal heart sounds and intact distal pulses.  Exam reveals no gallop and no friction rub.    No murmur heard.  Pulmonary/Chest: Effort normal and breath sounds normal. No respiratory distress. He has no wheezes.   Abdominal: Soft. Bowel sounds are normal. He exhibits no distension. There is no tenderness.   Neurological: He is alert and oriented to person, place, and time.   Skin: Skin is warm and dry. No rash noted. He is not diaphoretic. No erythema. No pallor.     Mental Status Exam:    Appearance:  casually dressed, dressed appropriately and no apparent distress.     Orientation: Fully oriented to person, place, time and situation.  .     Level of Consciousness:  alert   Behavior:  cooperative.     Eye Contact:  good.     Attention:  good.     Speech:  normal rate and volume.  Motor:  no psychomotor retardation or agitation.     Mood:  depressed.      Affect:  appears sad, congruent to mood, normal range and stable.     Thought Process:  goal oriented.     Thought Content:  no paranoia or delusions.    Suicidal Ideation:  none.     Homicidal Ideation:  none.   Memory:  grossly normal      Concentration:  good.    Perception:  no hallucinations endorsed   Cognition:  good abstract ability.     Insight:  fair.     Judgement:  good.           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 Brynda Greathouseandall at 3:15           (F41.1) GAD (generalized anxiety disorder)  Plan: sertraline (ZOLOFT) 50 mg Oral Tablet  [facility] Bry[facility]da Greathouse[facility]all at 3:15            (F10.99) Alcohol use disorder (HCC)  Plan: acamprosate (CAMPRAL) 333 mg Oral Tablet,         Delayed Release (E.C.)        [facility] Brynda Greathouseandall at 3:15    Zachery ConchMax Randall, MD  06/07/2016, 13:59          I saw and examined the patient.  I reviewed the resident's note.  I agree with the findings and plan of care as documented in the resident's note.  Any exceptions/additions are edited/noted.    Dagoberto LigasJason M Ammanda Dobbins, DO

## 2016-06-07 NOTE — Progress Notes (Signed)
Patient presents today wanting to speak to a psychiatrist about medications he was once on and a plan of care  with medication. Patient [facility]  Harmon DunSusan Kenzlie Disch, MA  06/07/2016, 12:58

## 2016-06-07 NOTE — Progress Notes (Signed)
Subjective:     Patient ID:  Frederick Schultz is an 35 y.o. male   Chief Complaint:    Chief Complaint   Patient presents with   . Medication Check       HPI nonsmoker always seatbelts wishes treatment for HTN has cough with lisinopril and muscles spasms with HCTZ   also reeval of poss bipolar  Cough is dry although he is asthmatic he does not feel he is wheezing  No longer on lithium and Zoloft and problems with loss of friends and work problems  Past Medical History:   Diagnosis Date   . Asthma    . Bipolar 1 disorder (HCC)      Fam hx of htn      Review of Systems no suicidal but erratic at work and with friends at times - no current cp or sob or bleeding  Objective:   Physical Exam BP 132/85  Pulse 83  Temp 36.4 C (97.5 F) (Thermal Scan)   Ht 1.676 m (5' 5.98")  Wt 96.6 kg (212 lb 15.4 oz)  BMI 34.39 kg/m2  nad  Good historian  Clear to auscultation no wheezes, heart rrr no murmurs, good upper and lower extremity pulses, no carotid, femoral or abdominal bruits, no thyroid nodules      Ortho Exam    Assessment & Plan:     (I10) HTN (hypertension)  (primary encounter diagnosis)  Plan: BMP ( non-fasting), ALT Standing Order every 3         months and PRN        Stop lisinopril and start amlodipine 2.5 for two days then 5 mg a day    Refer to psychiatric off service resident    RTC per routine prn or 3 mo    Labs discussed bmp alt    Bernita BuffyGregory A Orella Cushman, MD          Current Outpatient Prescriptions   Medication Sig   . acamprosate (CAMPRAL) 333 mg Oral Tablet, Delayed Release (E.C.) Take 2 Tabs (666 mg total) by mouth Three times a day   . amLODIPine (NORVASC) 5 mg Oral Tablet Take 1 Tab (5 mg total) by mouth Once a day   . sertraline (ZOLOFT) 50 mg Oral Tablet Take 1 Tab (50 mg total) by mouth Once a day

## 2016-06-14 ENCOUNTER — Ambulatory Visit (HOSPITAL_COMMUNITY): Payer: Self-pay | Admitting: Student in an Organized Health Care Education/Training Program

## 2016-06-14 NOTE — Telephone Encounter (Signed)
Case Manager called and left message informing pt of appt cancellation on 06/18/2016 . Case Manager left message with direct call back number for rescheduling.  Arcola Janskylivia Deonna Arthi Mcdonald, CASE MANAGER  06/14/2016, 15:55

## 2016-06-18 ENCOUNTER — Ambulatory Visit (HOSPITAL_COMMUNITY): Payer: Medicaid Other | Admitting: Student in an Organized Health Care Education/Training Program

## 2016-06-20 ENCOUNTER — Ambulatory Visit (HOSPITAL_COMMUNITY): Payer: Self-pay | Admitting: Psychiatry

## 2016-06-20 NOTE — Telephone Encounter (Signed)
Case Manager called and left message informing pt of appt cancellation. Case Manager sent letter with direct call back number for rescheduling  [name], CASE MANAGER  06/20/2016, 14:16

## 2016-07-11 ENCOUNTER — Ambulatory Visit (HOSPITAL_BASED_OUTPATIENT_CLINIC_OR_DEPARTMENT_OTHER): Payer: Medicaid Other | Admitting: Family Medicine

## 2016-07-16 ENCOUNTER — Ambulatory Visit (HOSPITAL_COMMUNITY): Payer: Medicaid Other | Admitting: Student in an Organized Health Care Education/Training Program

## 2016-07-24 ENCOUNTER — Ambulatory Visit (INDEPENDENT_AMBULATORY_CARE_PROVIDER_SITE_OTHER): Payer: Medicaid Other | Admitting: Psychiatry

## 2016-07-24 DIAGNOSIS — F411 Generalized anxiety disorder: Secondary | ICD-10-CM

## 2016-07-24 DIAGNOSIS — Z6833 Body mass index (BMI) 33.0-33.9, adult: Secondary | ICD-10-CM

## 2016-07-24 DIAGNOSIS — F331 Major depressive disorder, recurrent, moderate: Secondary | ICD-10-CM

## 2016-07-24 DIAGNOSIS — F319 Bipolar disorder, unspecified: Secondary | ICD-10-CM

## 2016-07-24 DIAGNOSIS — F419 Anxiety disorder, unspecified: Secondary | ICD-10-CM

## 2016-07-24 MED ORDER — SERTRALINE 25 MG TABLET
25.0000 mg | ORAL_TABLET | Freq: Every day | ORAL | 0 refills | Status: DC
Start: 2016-07-24 — End: 2016-08-08

## 2016-07-24 MED ORDER — LITHIUM CARBONATE 300 MG CAPSULE
300.00 mg | ORAL_CAPSULE | Freq: Every evening | ORAL | 0 refills | Status: DC
Start: 2016-07-24 — End: 2016-08-15

## 2016-07-24 MED ORDER — SERTRALINE 50 MG TABLET
ORAL_TABLET | ORAL | 0 refills | Status: DC
Start: 2016-07-24 — End: 2016-09-04

## 2016-07-30 NOTE — H&P (Addendum)
[facility]  Department of Behavioral Medicine and Psychiatry  Outpatient Initial H&P        Frederick AltesSean F Rainbow   [id number]  07/20/1981  07/30/2016    Date of service:  07/24/16    Requesting service/physician:  No service for patient encounter. / No att. providers found    Chief Complaint: new patient visit for depression and anxiety    ID:  Frederick Schultz is a 35 y.o[place] [facility] [place] [facility] [place] [facility] [place] [id number]      History of Present Illness:  This patient is a 35 y.o. White male with Bipolar disorder per patient who was seen as a new patient visit. Patient reported that he was on Lithium for about 2 yrs prescribed by Dr Huel CoteQuigley but was lost top follow up and has not been on it for the past 4 yrs. Patient reported that he used to have manic episodes before being started on lithium but does not remember the last time he has a manic episode. He reported that he has episodes where he only sleeps 2-3 hrs for up to 1 week and is very irritable and has mood swings during this time. He also reported racing thoughts and pressured speech but denied other symptoms of mania. Patient reported that he does not remember if being off lithium is different from being on it but his family is able to tell a difference. He reported that his family has noticed that his mood was more stable when he was on lithium. Patient reported that recently he has been struggling with depression and anxiety. He reports being depressed for a long time which has worsened recently. He was started on Zoloft 50 mg daily about 2 months ago but has not seen an improvement. Patient denied having active or passive SI, although does think about death a lot. He thinks about how everything will end one day. He looks at his dog and thinks that he will die one day too. He thinks about his own life and that he is "halfway to 2370 already". Patient reports that he would never kill himself but is worried about having these intrusive thoughts.  Patient endorsed  feeling hopeless and helpless. His energy has been good. His appetite is good. His sleep is variable.    Patient also reports feeling anxious all the time. He does not like it when his superiors micro-manage things and as a result feels anxious about his work as well as social situations.     Regarding traumatic events, patient denied.    Regarding thought and perception, patient denied.     SUBSTANCE ABUSE  Alcohol : used to drink heavily everyday until "I passed out" but quit 4 yrs ago.  Benzodiazapine's: no  THC: used in high school, not since  Opiates : no  Stimulants: no  Other: no    Home Medications:   Cannot display prior to admission medications because the patient has not been admitted in this contact.          Past Psychiatric History:  Current and past outpatient psychiatric treatment:   None currently. [facility] to [facility] Dr [facility]ley [facility] y[facility] ago.  Psychiatric hospitalizations:   Admitted when he was 35 yrs old in [place] for SI  Medication trials:   Lithium 450 mg daily. Prozac gave him manic episode. depakote.  Suicide attempts:   none      Allergies:  Allergies   Allergen Reactions    Juanetta SnowAsa Buff (Mag  Carb-Al Glyc) [Aspirin, Buffered]          Past Medical History:  no history of seizures  no history of traumatic brain injury  Past Medical History:   Diagnosis Date    Asthma     Bipolar 1 disorder (HCC)          Social History:   Live with room mate. Single, no kids, works in [employer].      Family History:    Suicides:  no   Mental illness:  no   Substance use:  no  Family Medical History     Problem Relation (Age of Onset)    Healthy Mother, Father    Heart Attack Father    High Cholesterol Father    Hypertension Father                ROS:   General:  No fever or chills   Vision:  No vision changes   HENT:  No sinus congestion or sore throat   Cardiac:  No chest pain or palpitations   Respiratory:  No shortness of breath or cough   Abdomen:  No nausea, vomiting, or  diarrhea    GU:  No dysuria or hematuria   Skin:  No rashes or bruises   Musculoskeletal:  No muscle or joint pain   Endocrine:  No diabetes or thyroid problems   Neuro:  No weakness or numbness   Psych:  Per HPI   All other review of systems negative      Physical Exam:  Blood pressure (!) 140/96, pulse 98, height 1.704 m (5' 7.09"), weight 96.8 kg (213 lb 6.5 oz).   General:  appears in good health and appears stated age   Eyes:  Conjunctiva clear; no nystagmus; no obvious EOM abnormalities   HENT:  Head is normocephalic, atraumatic    Neck:  Supple   Heart:  Regular rate and rhythm; normal S1 and S2   Lungs:  Clear to auscultation bilaterally   Abdomen:  Soft; nontender   Extremities:  No clubbing, cyanosis, or edema on exposed skin   Neurologic:  No obvious gait abnormalities, gross motor or sensory deficits, or tremulousness      Mental Status Exam:   Appearance:  casually dressed, appears actual age and no apparent distress   Behavior:  cooperative and eye contact  Good   Motor/MS:  normal gait   Speech:  normal   Mood:  "depressed"   Affect:  stable   Perception:  normal   Thought:  goal directed/coherent   Thought Content:  appropriate    Suicidal Ideation:  none.     Homicidal Ideation:  none   Level of Consciousness:  alert   Orientation:  grossly normal   Concentration:  good   Memory:  grossly normal   Conceptual:  abstract thinking   Judgement:  fair   Insight:  fair        ASSESSMENT:  Unspecified bipolar and related disorder, current episode depressed.  Unspecified anxiety disorder.      PLAN:    1. Medications:    - Re-Start Lithium 300 mg qhs   - Zoloft - take 75 mg daily for 2 weeks and then 100 mg daily after that.  2. Labs: none  3. Therapy: none currently  4. Safety Concerns: low  5. RTC in 4-6 weeks and earlier if needed. Patient advised to call with any questions or concerns.  6. Patient advised to report to  nearest emergency department or to call 911 if  having any suicidal or homicidal ideations.  7. Patient staffed with Dr. Zella Ball.     Armida Sans, MD   [facility] [place]  [address]  [place] [address]  Dept: [phone]  Loc: [phone]         I saw and examined the patient on 07/24/2016.  I reviewed the resident's note.  I agree with the findings and plan of care as documented in the resident's note.  Any exceptions/additions are edited/noted.    Roslyn Smiling, MD

## 2016-08-15 ENCOUNTER — Ambulatory Visit (HOSPITAL_COMMUNITY): Payer: Self-pay | Admitting: Psychiatry

## 2016-08-15 MED ORDER — LITHIUM CARBONATE 300 MG CAPSULE
300.0000 mg | ORAL_CAPSULE | Freq: Every evening | ORAL | 0 refills | Status: DC
Start: 2016-08-15 — End: 2016-08-28

## 2016-08-15 NOTE — Telephone Encounter (Addendum)
-----   Message from Guinea-BissauBrittany Palmer sent at 08/14/2016  9:52 AM EST -----  Hello,    The pharmacy is calling for a refill for the pt lithium carbonate (ESKALITH) 300 mg Oral Capsule. Please advise. Thank you.    Preferred Pharmacy     [id number] [place] [address]    [address] [place] [id number]    Phone: [phone] Fax: [phone]    Not a 24 hour pharmacy; exact hours not known          Pharmacy called with refill for Lithium 300 mg HS for 30 day supply.    Armida SansSadia Renley Banwart, MD

## 2016-08-15 NOTE — Telephone Encounter (Signed)
Regarding: medication  ----- Message from Fredrich RomansKelly Jo Denis sent at 08/14/2016  4:41 PM EST -----  Hello    Patient is requesting a refill for the following Rx. Please call patient to advise.    lithium carbonate (ESKALITH) 300 mg Oral Capsule      Preferred Pharmacy     [id number] [place] [address]    [address] [place] [id number]    Phone: [phone] Fax: [phone]    Not a 24 hour pharmacy; exact hours not known

## 2016-08-28 ENCOUNTER — Encounter (HOSPITAL_BASED_OUTPATIENT_CLINIC_OR_DEPARTMENT_OTHER): Payer: Self-pay | Admitting: Family Medicine

## 2016-08-28 ENCOUNTER — Ambulatory Visit (HOSPITAL_BASED_OUTPATIENT_CLINIC_OR_DEPARTMENT_OTHER): Payer: Self-pay | Admitting: Family Medicine

## 2016-08-28 ENCOUNTER — Ambulatory Visit: Payer: Medicaid Other | Attending: Family Medicine | Admitting: Family Medicine

## 2016-08-28 VITALS — BP 143/91 | HR 69 | Temp 98.2°F | Wt 206.1 lb

## 2016-08-28 DIAGNOSIS — Z23 Encounter for immunization: Secondary | ICD-10-CM | POA: Insufficient documentation

## 2016-08-28 DIAGNOSIS — F39 Unspecified mood [affective] disorder: Secondary | ICD-10-CM | POA: Insufficient documentation

## 2016-08-28 DIAGNOSIS — R4586 Emotional lability: Secondary | ICD-10-CM

## 2016-08-28 DIAGNOSIS — I10 Essential (primary) hypertension: Secondary | ICD-10-CM

## 2016-08-28 DIAGNOSIS — Z760 Encounter for issue of repeat prescription: Secondary | ICD-10-CM | POA: Insufficient documentation

## 2016-08-28 DIAGNOSIS — Z6832 Body mass index (BMI) 32.0-32.9, adult: Secondary | ICD-10-CM

## 2016-08-28 DIAGNOSIS — Z79899 Other long term (current) drug therapy: Secondary | ICD-10-CM | POA: Insufficient documentation

## 2016-08-28 DIAGNOSIS — M109 Gout, unspecified: Secondary | ICD-10-CM

## 2016-08-28 MED ORDER — LITHIUM CARBONATE 300 MG CAPSULE
300.0000 mg | ORAL_CAPSULE | Freq: Every evening | ORAL | 0 refills | Status: DC
Start: 2016-08-28 — End: 2016-09-27

## 2016-08-28 MED ORDER — AMLODIPINE 5 MG TABLET
7.5000 mg | ORAL_TABLET | Freq: Every day | ORAL | 5 refills | Status: DC
Start: 2016-08-28 — End: 2016-11-29

## 2016-08-28 NOTE — Telephone Encounter (Signed)
Patient's insurance wants to know why he is taking the 7.5mg  of Amlodipine instead of 10 mg before they agree to cover. Pharmacy tried to handle it, but insurance wanted a call from the actual physician's office. Office is closed currently.     [phone] Medicaid. Option 4 then option 3 to speak to them directly.   Albina BilletAmber Rhyleigh Grassel, [place]  08/28/2016, 17:18

## 2016-08-28 NOTE — Telephone Encounter (Signed)
Regarding: pharmacy call  ----- Message from Cristy Folksharlie Ann Geisel sent at 08/28/2016  4:39 PM EST -----  Bernita BuffyGregory A Doyle, MD    [facility] called and are asking for a call back about the following medication.    amLODIPine (NORVASC) 5 mg Oral Tablet    They stated that it has to do something with insurance but it is a unique circumstance.  Please return their call.    thanks

## 2016-08-28 NOTE — Nursing Note (Signed)
1. Are you 35 years of age or older? yes  3. Have you ever had a severe reaction to a flu shot? no  4. Are you allergic to eggs? no  5. Are you allergic to latex? yes  6. Are you allergic to Thimerosol? no  7. Are you experiencing acute illness symptoms or have you been running a fever? no  8. Do you have a medical condition or taking medications that suppress your immune system? no  9. Have you ever had Guillain-Barre syndrome or other neurologic disorder? no  10. Are you pregnant or breastfeeding? no    Immunization administered     Name Date Dose VIS Date Route    INFLUENZA VACCINE IM 08/28/2016 0.5 mL 04/16/2014 Intramuscular    Site: Left deltoid    Given By: Albina BilletGraham, Bartlett Enke, MA    Manufacturer: GlaxoSmithK[employer]    Lot: [id number]: [id number]    NDC: [id number]: [id number]        Patient has a mild sensitivity to latex, redness when touched. No previous reaction to flu vaccine before. Physician said it would be fine, patient gave permission.     Albina BilletAmber Emmy Keng, [place] 08/28/2016, 16:07

## 2016-08-28 NOTE — Progress Notes (Signed)
[facility], [place] [facility]  [address]  [place] [id number]  [facility]    Name: Frederick AltesSean F Alemu  Age and Gender: 35 y.o. male   MRN:  [id number]  Date: 08/28/2016    SUBJECTIVE:    Chief Complaint   Patient presents with    Medication Check       History of Present Illness    Frederick AltesSean F Nease is a [age] y.o. male  who presents to clinic today for medication.    Formersmoker with PMHx of HTN, depression. Not stable, controlled by Amlodipine and Zoloft. BP is running high at home, 140/90 and higher. Moods are "so/so" getting better but not there yet. He follows with behavioral med, his next appointment is in another week.     Feels he had a gout attack first mtp left foot 4 or more days ago - better with cherry juice and vinegar   Discussed normal triggers including alcohol and xray etc    Patient has no complaints today.    He wears seatbelts.    Medication reconciliation done in today's visit, UTD. Lithium refill  Patient history updated where appropriate.     Past Medical History:   Diagnosis Date    Asthma     Bipolar 1 disorder (HCC)                Current Outpatient Prescriptions   Medication Sig    acamprosate (CAMPRAL) 333 mg Oral Tablet, Delayed Release (E.C.) Take 2 Tabs (666 mg total) by mouth Three times a day    amLODIPine (NORVASC) 5 mg Oral Tablet Take 1 Tab (5 mg total) by mouth Once a day    lithium carbonate (ESKALITH) 300 mg Oral Capsule Take 1 Cap (300 mg total) by mouth Every evening for 30 days    sertraline (ZOLOFT) 50 mg Oral Tablet Please take zoloft 75 mg (50 mg tab and 25 mg tab) for 15 days, then increase to 100 mg daily (2 x 50 mg tabs)       ROS    Constitutional: No fevers or chills  Skin: No rash  HENT: No sore throat, congestion, ear pain, or sinus pressure  Eyes: No vision changes  Cardiovascular: No chest pain or palpitations  Respiratory: No shortness of breath   Gastrointestinal: No blood in stool  Genitourinary: No blood in  urine  Neurologic: No headaches, dizziness, or tremors   Psychiatric: No behavioral changes or agitation    All other systems reviewed and are negative.      OBJECTIVE:    Physical Exam    BP (!) 143/91   Pulse 69   Temp 36.8 C (98.2 F) (Thermal Scan)    Wt 93.5 kg (206 lb 2.1 oz)   SpO2 99%   BMI 32.2 kg/m2    Constitutional: Well developed, well nourished. No acute distress.  Pulmonary: Clear to auscultation, no wheezes.  Cardiovascular: Heart RRR, no murmurs, good upper and lower extremity pulses. No carotid or femoral bruits.    Abdominal: No abdominal bruits.  Neck: No thyroid nodules.   1st mtp left not red or warm  Vital signs and nursing notes reviewed.  Good Historian     ASSESSMENT AND PLAN:    ICD-10-CM    1. Mood swings (HCC) F39 lithium carbonate (ESKALITH) 300 mg Oral Capsule   2. Need for vaccination Z23 INFLUENZA VACCINE IM AGE 48 THROUGH ADULT (ADMIN)   3. Essential hypertension  I10 amLODIPine (NORVASC) 5 mg Oral Tablet      Increasing amlodipine call with another joint issue and come in that day if possible  Follow up in 3 months and PRN.    I, Albina BilletAmber Graham, MA, personally collected the patients Review of Systems (ROS) & Past Medical, Family, & Social Histories [facility]). In addition to this I am scribing the remainder of the note for, and in the presence of, the physician for all the services provided on 08/28/2016.  Electronically Signed By: Albina BilletAmber Graham, MA  08/28/2016, 15:47          I have reviewed and agree with the Review of Symptoms (ROS) & Past Medical, Family, & Social Histories [facility]) collected and documented by the ancillary staff.  I, Bernita BuffyGregory A Denay Pleitez, MD, personally performed all of the additional services described in this documentation, as scribed by Albina BilletAmber Graham who was in my presence.  I confirm that the documentation is both accurate and complete.   Electronically Signed By: Bernita BuffyGregory A Exilda Wilhite, MD  08/28/2016, 16:27

## 2016-08-28 NOTE — Patient Instructions (Signed)
2017-2018 Vaccine Information Statement    Influenza (Flu) Vaccine (Inactivated or Recombinant): What you need to know    Many Vaccine Information Statements are available in Spanish and other languages. See www.immunize.org/vis  Hojas de Información Sobre Vacunas están disponibles en Español y en muchos otros idiomas. Visite www.immunize.org/vis    1. Why get vaccinated?    Influenza ("flu") is a contagious disease that spreads around the [place] every year, usually between October and May.     Flu is caused by influenza viruses, and is spread mainly by coughing, sneezing, and close contact.     Anyone can get flu. Flu strikes suddenly and can last several days. Symptoms vary by age, but can include:  o fever/chills  o sore throat  o muscle aches  o fatigue  o cough  o headache   o runny or stuffy nose    Flu can also lead to pneumonia and blood infections, and cause diarrhea and seizures in children.  If you have a medical condition, such as heart or lung disease, flu can make it worse.    Flu is more dangerous for some people. Infants and young children, people 65 years of age and older, pregnant women, and people with certain health conditions or a weakened immune system are at greatest risk.      Each year thousands of people in the [place] die from flu, and many more are hospitalized.     Flu vaccine can:  o keep you from getting flu,  o make flu less severe if you do get it, and  o keep you from spreading flu to your family and other people.     2. Inactivated and recombinant flu vaccines    A dose of flu vaccine is recommended every flu season. Children 6 months through 8 years of age may need two doses during the same flu season.  Everyone else needs only one dose each flu season.       Some inactivated flu vaccines contain a very small amount of a mercury-based preservative called thimerosal. Studies have not shown thimerosal in vaccines to be harmful, but flu vaccines that do not contain  thimerosal are available.    There is no live flu virus in flu shots.  They cannot cause the flu.     There are many flu viruses, and they are always changing. Each year a new flu vaccine is made to protect against three or four viruses that are likely to cause disease in the upcoming flu season. But even when the vaccine doesn't exactly match these viruses, it may still provide some protection    Flu vaccine cannot prevent:  o flu that is caused by a virus not covered by the vaccine, or  o illnesses that look like flu but are not.    It takes about 2 weeks for protection to develop after vaccination, and protection lasts through the flu season.     3. Some people should not get this vaccine    Tell the person who is giving you the vaccine:    o If you have any severe, life-threatening allergies.    If you ever had a life-threatening allergic reaction after a dose of flu vaccine, or have a severe allergy to any part of this vaccine, you may be advised not to get vaccinated.  Most, but not all, types of flu vaccine contain a small amount of egg protein.       o If   you ever had Guillain-Barré Syndrome (also called GBS).   Some people with a history of GBS should not get this vaccine. This should be discussed with your doctor.    o If you are not feeling well.    It is usually okay to get flu vaccine when you have a mild illness, but you might be asked to come back when you feel better.      4. Risks of a vaccine reaction     With any medicine, including vaccines, there is a chance of reactions. These are usually mild and go away on their own, but serious reactions are also possible.     Most people who get a flu shot do not have any problems with it.     Minor problems following a flu shot include:   o soreness, redness, or swelling where the shot was given    o hoarseness  o sore, red or itchy eyes  o cough  o fever  o aches  o headache  o itching  o fatigue  If these problems occur, they usually begin soon after the  shot and last 1 or 2 days.     More serious problems following a flu shot can include the following:    o There may be a small increased risk of Guillain-Barré Syndrome (GBS) after inactivated flu vaccine.  This risk has been estimated at 1 or 2 additional cases per million people vaccinated. This is much lower than the risk of severe complications from flu, which can be prevented by flu vaccine.      o Young children who get the flu shot along with pneumococcal vaccine (PCV13) and/or DTaP vaccine at the same time might be slightly more likely to have a seizure caused by fever. Ask your doctor for more information. Tell your doctor if a child who is getting flu vaccine has ever had a seizure.     Problems that could happen after any injected vaccine:     o People sometimes faint after a medical procedure, including vaccination. Sitting or lying down for about 15 minutes can help prevent fainting, and injuries caused by a fall. Tell your doctor if you feel dizzy, or have vision changes or ringing in the ears.    o Some people get severe pain in the shoulder and have difficulty moving the arm where a shot was given. This happens very rarely.    o Any medication can cause a severe allergic reaction. Such reactions from a vaccine are very rare, estimated at about 1 in a million doses, and would happen within a few minutes to a few hours after the vaccination.    As with any medicine, there is a very remote chance of a vaccine causing a serious injury or death.    The safety of vaccines is always being monitored. For more information, visit: www.cdc.gov/vaccinesafety/    5. What if there is a serious reaction?    What should I look for?    o Look for anything that concerns you, such as signs of a severe allergic reaction, very high fever, or unusual behavior.    Signs of a severe allergic reaction can include hives, swelling of the face and throat, difficulty breathing, a fast heartbeat, dizziness, and weakness - usually  within a few minutes to a few hours after the vaccination.    What should I do?    o If you think it is a severe allergic reaction or other   emergency that can't wait, call 9-1-1 and get the person to the nearest hospital. Otherwise, call your doctor.    o Reactions should be reported to the Vaccine Adverse Event Reporting System (VAERS). Your doctor should file this report, or you can do it yourself through  the VAERS web site at www.vaers.hhs.gov, or by calling 1-800-822-7967.    VAERS does not give medical advice.    6. The National Vaccine Injury Compensation Program    The National Vaccine Injury Compensation Program (VICP) is a federal program that was created to compensate people who may have been injured by certain vaccines.    Persons who believe they may have been injured by a vaccine can learn about the program and about filing a claim by calling 1-800-338-2382 or visiting the VICP website at www.hrsa.gov/vaccinecompensation.  There is a time limit to file a claim for compensation.    7. How can I learn more?  o Ask your healthcare provider. He or she can give you the vaccine package insert or suggest other sources of information.  o Call your local or state health department.  o Contact the Centers for Disease Control and Prevention (CDC):  - Call 1-800-232-4636 (1-800-CDC-INFO) or  - Visit CDC's website at www.cdc.gov/flu    Vaccine Information Statement   Inactivated Influenza Vaccine   04/16/2014  42 U.S.C. § 300aa-26    Department of Health and Human Services  Centers for Disease Control and Prevention    Office Use Only

## 2016-09-04 ENCOUNTER — Ambulatory Visit (INDEPENDENT_AMBULATORY_CARE_PROVIDER_SITE_OTHER): Payer: Medicaid Other | Admitting: Psychiatry

## 2016-09-04 DIAGNOSIS — F331 Major depressive disorder, recurrent, moderate: Secondary | ICD-10-CM

## 2016-09-04 DIAGNOSIS — F3130 Bipolar disorder, current episode depressed, mild or moderate severity, unspecified: Secondary | ICD-10-CM

## 2016-09-04 DIAGNOSIS — F419 Anxiety disorder, unspecified: Secondary | ICD-10-CM

## 2016-09-04 DIAGNOSIS — F411 Generalized anxiety disorder: Secondary | ICD-10-CM

## 2016-09-04 MED ORDER — SERTRALINE 50 MG TABLET
150.0000 mg | ORAL_TABLET | Freq: Every day | ORAL | 1 refills | Status: DC
Start: 2016-09-04 — End: 2016-09-04

## 2016-09-04 MED ORDER — SERTRALINE 50 MG TABLET
150.0000 mg | ORAL_TABLET | Freq: Every day | ORAL | 1 refills | Status: DC
Start: 2016-09-04 — End: 2016-10-04

## 2016-09-04 NOTE — Progress Notes (Signed)
Behavioral Medicine and Psychiatry   Outpatient Progress Note      Frederick Schultz   [id number]  12/27/1980  09/04/2016    DOS: 09/04/2016    CC: F/u for medication management    SUBJECTIVE:  Patient is a 35 y.o. male who presents to clinic as a follow up for depression. He was last seen by this provider on 07/24/16. Since his last appointment, patient states he has been doing well. Patient reports that he has not felt a difference on his medications but his family and friend have told him that he as improved. He still feels frustrated and reports having a feeling that he is "unlucky and things don't work as well for me as for other people". He continues to feel depressed and has low mood more days than usual. He denied any SI, HI or AVH. His sleep and appetite have been normal.  Again tried to clarify a diganosis of bipolar disorder which patient rpeorts he has been diagnosed with. He was on lithium for about 7-8 yrs and felt it was help, but he denies having a manic episode. He complains of mood lability and anger episodes which were improved with lithium and reports that he wants to be on the same dose he was on in the past i.e 450 mg BID.     Reported side effects: none     MEDICATIONS:    Outpatient Medications Prior to Visit:  acamprosate (CAMPRAL) 333 mg Oral Tablet, Delayed Release (E.C.) Take 2 Tabs (666 mg total) by mouth Three times a day   amLODIPine (NORVASC) 5 mg Oral Tablet Take 1.5 Tabs (7.5 mg total) by mouth Once a day   lithium carbonate (ESKALITH) 300 mg Oral Capsule Take 1 Cap (300 mg total) by mouth Every evening for 30 days   sertraline (ZOLOFT) 50 mg Oral Tablet Please take zoloft 75 mg (50 mg tab and 25 mg tab) for 15 days, then increase to 100 mg daily (2 x 50 mg tabs)     No facility-administered medications prior to visit.     PHYSICAL:  No vitals today    General: no acute distress, patient sitting comfortably in chair during interview  Neuro: normal gait, no abnormal movements  Skin: no rashes  or lesions noted on exposed skin    OBJECTIVE:  Mental Status Exam:   Appearance:  casually dressed, appears actual age and no apparent distress   Behavior:  cooperative and eye contact  Good   Motor/MS:  normal gait   Speech:  normal   Mood:  "depressed"   Affect:  stable   Perception:  normal   Thought:  goal directed/coherent   Thought Content:  appropriate    Suicidal Ideation:  none reported. Want to feel better. Does not feel hopeless or helpless.   Homicidal Ideation:  none   Level of Consciousness:  alert   Orientation:  grossly normal   Concentration:  good   Memory:  grossly normal   Conceptual:  abstract thinking   Judgement:  good   Insight:  good     Social History:  No changes    ASSESSMENT:  Unspecified bipolar and related disorder, current episode depressed.  Unspecified anxiety disorder.    PLAN:    1. Medications:    - Lithium 300 mg qhs   - Zoloft - increase to 150 mg daily today.  2. Labs: none  3. Therapy: will refer to therapy  4. Safety Concerns: low  5.  RTC in 4-6 weeks. Patient advised to call with any questions or concerns.  6. Patient advised to report to nearest emergency department or to call 911 if having any suicidal or homicidal ideations.  7. Patient staffed with Dr. Evaristo Buryooper-Lehki.     Armida SansSadia Chaudhary, MD 09/04/2016, 13:23  [facility] [place]  [address]  [place] [id number]  Dept: [phone]  Loc: [phone]     I saw and examined the patient on the date of service 09/04/2016.  I reviewed the resident's note.  I agree with the findings and plan of care as documented in the resident's note.  Any exceptions/additions are edited/noted.    Floretta Petro [place] Manorooper-Lehki, DO

## 2016-09-13 ENCOUNTER — Telehealth (HOSPITAL_COMMUNITY): Payer: Self-pay

## 2016-09-13 NOTE — Telephone Encounter (Signed)
-----   Message from Armida SansSadia Chaudhary, MD sent at 09/13/2016  1:55 PM EST -----  Meriam SpragueHi Kore Madlock,     This patient who I see in my clinic would benefit from therapy. I was not sure who to contact for scheduling for therapy. Please advice.   Thanks.    Leanna SatoSadia

## 2016-09-13 NOTE — Telephone Encounter (Signed)
Pt has been added to Epic waitlist for NPV appointment. Letter was sent informing the patient he is now in the wait list for therapy   Arcola JanskyOlivia Deonna Maraya Gwilliam, CASE MANAGER  09/13/2016, 14:14

## 2016-10-04 ENCOUNTER — Ambulatory Visit (HOSPITAL_COMMUNITY): Payer: Self-pay | Admitting: Psychiatry

## 2016-10-04 MED ORDER — LITHIUM CARBONATE 300 MG CAPSULE
300.0000 mg | ORAL_CAPSULE | Freq: Every day | ORAL | 1 refills | Status: DC
Start: 2016-10-04 — End: 2016-11-03

## 2016-10-04 NOTE — Telephone Encounter (Addendum)
-----   Message from Roswell Nickelatherine Marissa Reigel sent at 10/02/2016  3:09 PM EST -----    Pt is requesting a refill on the following medication. Please call pt to advise [phone]      lithium carbonate (ESKALITH) 300 mg Oral Capsule       Preferred Pharmacy     [id number] [place] [address]    [address] [place] [id number]    Phone: [phone] Fax: [phone]    Not a 24 hour pharmacy; exact hours not known        Thank you!  Santina EvansCatherine        Refilled medication: Lithium 300 mg daily for 1 month.    Armida SansSadia Kea Callan, MD  10/04/2016, 12:38

## 2016-10-09 ENCOUNTER — Ambulatory Visit (INDEPENDENT_AMBULATORY_CARE_PROVIDER_SITE_OTHER): Payer: Medicaid Other | Admitting: Psychiatry

## 2016-10-09 DIAGNOSIS — F329 Major depressive disorder, single episode, unspecified: Secondary | ICD-10-CM

## 2016-10-09 DIAGNOSIS — F419 Anxiety disorder, unspecified: Secondary | ICD-10-CM

## 2016-10-09 DIAGNOSIS — F32A Depression, unspecified: Secondary | ICD-10-CM

## 2016-10-09 MED ORDER — SERTRALINE 50 MG TABLET
150.0000 mg | ORAL_TABLET | Freq: Every day | ORAL | 2 refills | Status: DC
Start: 2016-10-09 — End: 2016-11-13

## 2016-10-09 NOTE — Progress Notes (Signed)
Behavioral Medicine and Psychiatry   Outpatient Progress Note      Frederick Schultz   [id number]  07-23-1981  10/09/2016    DOS: 10/09/2016    CC: F/u for medication management    SUBJECTIVE:  Patient is a 36 y.o. male who presents to clinic as a follow up for depression. He was last seen by this provider on 09/04/17. Patient was 14 mins late to current appointment. He reported that he feels stable on his medications and requested for refills on Zoloft. He already has refills for Lithium. He denied any adverse effects. He denied any SI, HI or AVH. Patient asked for increase in dose of lithium which will be discussed further in the next appointment due to time constraints today with patient being late to appointment. Patient was agreeable with this plan. He did not have any questions or concerns today.    Reported side effects: none     MEDICATIONS:    Outpatient Medications Prior to Visit:  acamprosate (CAMPRAL) 333 mg Oral Tablet, Delayed Release (E.C.) Take 2 Tabs (666 mg total) by mouth Three times a day   amLODIPine (NORVASC) 5 mg Oral Tablet Take 1.5 Tabs (7.5 mg total) by mouth Once a day   lithium carbonate (ESKALITH) 300 mg Oral Capsule Take 1 Cap (300 mg total) by mouth Once a day for 30 days     No facility-administered medications prior to visit.     PHYSICAL:  General: no acute distress, patient sitting comfortably in chair during interview  Neuro: normal gait, no abnormal movements  Skin: no rashes or lesions noted on exposed skin    OBJECTIVE:  Mental Status Exam:   Appearance:  casually dressed, appears actual age and no apparent distress   Behavior:  cooperative and eye contact  Good   Motor/MS:  normal gait   Speech:  normal   Mood:  euthymic   Affect:  stable   Perception:  normal   Thought:  goal directed/coherent   Thought Content:  appropriate    Suicidal Ideation:  none.     Homicidal Ideation:  none   Level of Consciousness:  alert   Orientation:  grossly normal   Concentration:  good    Memory:  grossly normal   Conceptual:  abstract thinking   Judgement:  fair   Insight:  fair      Social History:  No changes    ASSESSMENT:  Unspecified depressive disorder.  Unspecified anxiety disorder.  This is a 36 yo M with Unspecified depressive disorder and Unspecified anxiety disorder who is seen by this provider for his depression. Patient reports a history of Bipolar disorder and has been on Lithium 300 mg TID in the past. From detailed history taken from patient, he has never had a manic episode and does not meet criteria for Bipolar disorder. Per patient he was prescribed for mood swings and anger episodes. He is currently on Lithium 300 mg HS which is currently being used as an adjunct to Zoloft for depression in this patient. He is also on Zoloft 150 mg daily. No medication changes made today.    PLAN:    1. Medications:                           - Lithium 300 mg qhs                          -  Zoloft 150 mg daily.  2. Labs: none  3. Therapy: will refer to therapy  4. Safety Concerns: low  5. RTC in 4-6 weeks. Patient advised to call with any questions or concerns.  6. Patient advised to report to nearest emergency department or to call 911 if having any suicidal or homicidal ideations.  7. Patient staffed with Dr. Reed Pandyamsey.     Armida SansSadia Chaudhary, MD 10/09/2016, 14:02  [facility] [place]  [address]  [place] [id number]  Dept: [phone]  Loc: [phone]     Late entry for  10/09/2016. I saw and examined the patient.  I reviewed the resident's note.  I agree with the findings and plan of care as documented in the resident's note.  Any exceptions/additions are edited/noted.    Purvis SheffieldKirk Alexander Ramsey, MD 10/10/2016, 11:01

## 2016-11-13 ENCOUNTER — Ambulatory Visit (HOSPITAL_COMMUNITY): Payer: Self-pay | Admitting: Psychiatry

## 2016-11-13 MED ORDER — SERTRALINE 50 MG TABLET
150.0000 mg | ORAL_TABLET | Freq: Every day | ORAL | 2 refills | Status: DC
Start: 2016-11-13 — End: 2016-12-13

## 2016-11-13 NOTE — Telephone Encounter (Addendum)
-----   Message from Vivianne MasterLogan Carpenter sent at 11/13/2016  9:54 AM EST -----  Brendolyn Pattyhaudhary pt     Pharmacy is requesting a rx refill for the following Rx(s): pharmacy already gave pt an emergency refill.  sertraline (ZOLOFT) 50 mg Oral Tablet 90 Tab 2 10/09/2016     Sig - Route: Take 3 Tabs (150 mg total) by mouth Once a day - Oral    Please call pharmacy to advise.          Medication refilled.  Armida SansSadia Kerina Simoneau, MD  11/13/2016, 10:03

## 2016-11-29 ENCOUNTER — Ambulatory Visit: Payer: Medicaid Other | Attending: Family Medicine | Admitting: Family Medicine

## 2016-11-29 ENCOUNTER — Encounter (HOSPITAL_BASED_OUTPATIENT_CLINIC_OR_DEPARTMENT_OTHER): Payer: Self-pay | Admitting: Family Medicine

## 2016-11-29 VITALS — BP 131/89 | HR 89 | Temp 98.4°F | Wt 207.5 lb

## 2016-11-29 DIAGNOSIS — E785 Hyperlipidemia, unspecified: Secondary | ICD-10-CM | POA: Insufficient documentation

## 2016-11-29 DIAGNOSIS — Z79899 Other long term (current) drug therapy: Secondary | ICD-10-CM | POA: Insufficient documentation

## 2016-11-29 DIAGNOSIS — F411 Generalized anxiety disorder: Secondary | ICD-10-CM | POA: Insufficient documentation

## 2016-11-29 DIAGNOSIS — Z87891 Personal history of nicotine dependence: Secondary | ICD-10-CM | POA: Insufficient documentation

## 2016-11-29 DIAGNOSIS — Z6832 Body mass index (BMI) 32.0-32.9, adult: Secondary | ICD-10-CM

## 2016-11-29 DIAGNOSIS — I10 Essential (primary) hypertension: Secondary | ICD-10-CM | POA: Insufficient documentation

## 2016-11-29 DIAGNOSIS — F39 Unspecified mood [affective] disorder: Secondary | ICD-10-CM

## 2016-11-29 DIAGNOSIS — F319 Bipolar disorder, unspecified: Secondary | ICD-10-CM | POA: Insufficient documentation

## 2016-11-29 MED ORDER — AMLODIPINE 10 MG TABLET
10.0000 mg | ORAL_TABLET | Freq: Every day | ORAL | 3 refills | Status: DC
Start: 2016-11-29 — End: 2017-12-17

## 2016-11-29 NOTE — Progress Notes (Signed)
[facility] [place] [facility]  [address]  [place] [id number]  [facility]    Name: Frederick AltesSean F Lavergne  Age and Gender: 36 y.o. male   MRN:  [id number]  Date: 11/29/2016    SUBJECTIVE:    Chief Complaint   Patient presents with    Follow Up 3 Months     medication check also        History of Present Illness    Frederick AltesSean F Fahrney is a 36 y.o. male  who presents to clinic today for follow up and medication check.    Former smoker with PMHx of HTN, asthma, and MDD. Stable, controlled by Amlodipine 5 mg daily, Zoloft 50 mg daily and lithium 300 mg daily. .  Not taking campral  Patient has no complaints today. He feels his moods are doing better than before. He also feels he is doing better in general.     He wears seatbelts and is unemployed, looking for a new job.    Medication reconciliation done in today's visit, UTD. No refills needed.   Patient history updated where appropriate.     Past Medical History:   Diagnosis Date    Asthma     Bipolar 1 disorder (HCC)      No pertinent past surgeries.     Current Outpatient Prescriptions   Medication Sig    acamprosate (CAMPRAL) 333 mg Oral Tablet, Delayed Release (E.C.) Take 2 Tabs (666 mg total) by mouth Three times a day (Patient not taking: Reported on 11/29/2016)    amLODIPine (NORVASC) 5 mg Oral Tablet Take 1.5 Tabs (7.5 mg total) by mouth Once a day    lithium carbonate (ESKALITH) 300 mg Oral Capsule Take 300 mg by mouth Once a day    sertraline (ZOLOFT) 50 mg Oral Tablet Take 3 Tabs (150 mg total) by mouth Once a day for 30 days       ROS    Constitutional: No fevers or chills  Skin: No rash  HENT: No sore throat, congestion, ear pain, or sinus pressure  Eyes: No vision changes  Cardiovascular: No chest pain or palpitations  Respiratory: No shortness of breath   Gastrointestinal: No blood in stool  Genitourinary: No blood in urine  Neurologic: No headaches, dizziness, or tremors   Psychiatric: No behavioral changes or  agitation    All other systems reviewed and are negative.      OBJECTIVE:    Physical Exam    BP 131/89   Pulse 89   Temp 36.9 C (98.4 F) (Thermal Scan)    Wt 94.1 kg (207 lb 7.3 oz)   SpO2 96%   BMI 32.41 kg/m2    Constitutional: Well developed, well nourished. No acute distress.  Pulmonary: Clear to auscultation, no wheezes.  Cardiovascular: Heart RRR, no murmurs, good upper and lower extremity pulses. No carotid or femoral bruits.    Abdominal: No abdominal bruit    Vital signs and nursing notes reviewed.  Good Historian     ASSESSMENT AND PLAN:    ICD-10-CM    1. HTN (hypertension) I10 amLODIPine (NORVASC) 10 mg Oral Tablet   2. Mood disorder (HCC) F39    3. Hyperlipidemia, unspecified hyperlipidemia type E78.5      Exercise and healthy diet advised. 150 minutes of walking a week    Follow up in 3 months and PRN.    I, Albina BilletAmber Graham, MA, personally collected the patients Review of Systems (  ROS) & Past Medical, Family, & Social Histories [facility]). In addition to this I am scribing the remainder of the note for, and in the presence of, the physician for all the services provided on 11/29/2016.  Electronically Signed By: Albina Billet, MA  11/29/2016, 11:24          I have reviewed and agree with the Review of Symptoms (ROS) & Past Medical, Family, & Social Histories [facility]) collected and documented by the ancillary staff.  I, Bernita Buffy, MD, personally performed all of the additional services described in this documentation, as scribed by amber graham who was in my presence.  I confirm that the documentation is both accurate and complete.   Electronically Signed By: Bernita Buffy, MD  11/29/2016, 13:08

## 2016-11-29 NOTE — Progress Notes (Signed)
[facility] [place]   Department of Family Medicine   Clinic Note    Frederick AltesSean F Schultz  MRN: [id number]  DOB: 08/20/1981  Date of Service: 11/29/2016    CHIEF COMPLAINT  Chief Complaint   Patient presents with   . Follow Up 3 Months     medication check also        SUBJECTIVE  Frederick Schultz is a 36 y.o. male who presents to clinic for 3 month follow up. He is followed in clinic for HTN and Depression. Patient states that his mood has been overall good since [facility]. Regarding his HTN, he reports that he checks his blood pressure at home and that the numbers typically fall in the low 140's over low 90's. He has been taking his amlodipine regularly. He denies any chest pain, headaches, trouble breathing, or lightheadedness.   He has no new complaints today. He states that he is no longer employed at [employer] and is actively seeking new work, being supported financially by his roommate for the time being. He is also working on increasing his walking and cooking more at home using a meal prep subscription service.      Past Medical History:   Diagnosis Date   . Asthma    . Bipolar 1 disorder (HCC)            Review of Systems:  Constitutional: negative for fevers, chills  Eyes: negative for visual disturbance  Ears, nose, mouth, throat, and face: negative for earaches, nasal congestion and sore throat  Respiratory: negative for cough or dyspnea  Cardiovascular: negative for chest pain and palpitations  Gastrointestinal: negative for reflux symptoms, nausea, vomiting, change in bowel habits  Genitourinary: negative for dysuria and hematuria  Integument: negative for rash  Musculoskeletal: negative for myalgias and arthralgias  Neurological: negative for headaches, paresthesia and weakness  Behavioral/Psych: improved mood per HPI  Endocrine: negative for polyuria, polydipsia, and temperature intolerance    Medications:     Outpatient Medications Prior to Visit:  acamprosate  (CAMPRAL) 333 mg Oral Tablet, Delayed Release (E.C.) Take 2 Tabs (666 mg total) by mouth Three times a day (Patient not taking: Reported on 11/29/2016)   sertraline (ZOLOFT) 50 mg Oral Tablet Take 3 Tabs (150 mg total) by mouth Once a day for 30 days   amLODIPine (NORVASC) 5 mg Oral Tablet Take 1.5 Tabs (7.5 mg total) by mouth Once a day     No facility-administered medications prior to visit.     Allergies:  Allergies   Allergen Reactions   . Juanetta SnowAsa Buff (Mag Carb-Al Glyc) Eilene Ghazi[Aspirin, Buffered]        Past Surgical History:  Past Surgical History:   Procedure Laterality Date   . ABDOMINAL HERNIA REPAIR             Family History:  Family Medical History     Problem Relation (Age of Onset)    Healthy Mother, Father    Heart Attack Father    High Cholesterol Father    Hypertension Father              Social History:  Social History     Social History   . Marital status: Single     Spouse name: N/A   . Number of children: N/A   . Years of education: N/A     Occupational History   . [employer]      Social History Main  Topics   . Smoking status: Former Games developer   . Smokeless tobacco: Never Used   . Alcohol use 0.0 oz/week     0 Standard drinks or equivalent per week      Comment: rare   . Drug use: No   . Sexual activity: Not on file     Other Topics Concern   . Not on file     Social History Narrative           OBJECTIVE  BP 131/89  Pulse 89  Temp 36.9 C (98.4 F) (Thermal Scan)   Wt 94.1 kg (207 lb 7.3 oz)  SpO2 96%  BMI 32.41 kg/m2  General: no distress  HEENT: TMs clear, sclera non-icteric, mouth mucous membranes moist, pharynx without injection or exudate   Lungs: clear to auscultation bilaterally  Cardiovascular: RRR, no murmur  Abdomen: soft, non tender, bowel sounds present  Extremities: no cyanosis or edema  Skin: warm and dry, no rash  Neurologic: gait is normal, AOx3, CN 2-12 grossly intact  Psychiatric: normal affect and behavior      Labs:  None      Images:  None      ASSESSMENT/PLAN  Frederick Schultz a 36 y.o.  male presenting for 3 month follow up for HTN and Depression. His blood pressure is still being recorded as mildly elevated and he has borderline diastolic BP in the office today. His depression is being managed at [facility] by Dr. Brendolyn Patty and he reports that it has been under fairly good control. He has no complaints at this time.    1. HTN (hypertension)  - Mildly elevated per home recordings, borderline diastolic BP in office  - Patient advised to bring home BP cuff at next visit to assess for problems with cuff size  - amLODIPine (NORVASC) 10 mg Oral Tablet; Take 1 Tab (10 mg total) by mouth Once a day  Dispense: 90 Tab; Refill: 3  Gad=can take amlodipine 10 1/2 bid to start and check BP cuff here next visit  2. Mood disorder (HCC)  - Continue Zoloft and Lithium  - Management per Dr. Brendolyn Patty at [facility]    3. Hyperlipidemia, unspecified hyperlipidemia type  - Last lipid profile WNL 02/2016 except for low HDL (32).   - Advised to keep walking at least 150 minutes per week.  - Will reassess in September          Frederick Shock, MD 11/29/2016, 12:11        I saw and examined the patient.  I reviewed the resident's note.  I agree with the findings and plan of care as documented in the resident's note.  Any exceptions/additions are edited/noted.    Frederick Buffy, MD

## 2016-12-11 ENCOUNTER — Encounter (HOSPITAL_COMMUNITY): Payer: Medicaid Other | Admitting: Psychiatry

## 2016-12-13 ENCOUNTER — Ambulatory Visit (HOSPITAL_BASED_OUTPATIENT_CLINIC_OR_DEPARTMENT_OTHER): Payer: Self-pay | Admitting: Family Medicine

## 2016-12-13 MED ORDER — LITHIUM CARBONATE 300 MG CAPSULE
300.0000 mg | ORAL_CAPSULE | Freq: Every day | ORAL | 1 refills | Status: DC
Start: 2016-12-13 — End: 2017-01-18

## 2016-12-13 NOTE — Telephone Encounter (Signed)
-----   Message from Gerhard Munch sent at 12/13/2016  3:41 PM EDT -----  Cheree Ditto    Pt  said that he was needing more medication.  His last appointment on 4/3 was cancelled.  He'll be out of the medication by the end of the week.  Please call to advise.     lithium carbonate (ESKALITH) 300 mg Oral Capsule      Sig : Take 300 mg by mouth Once a day

## 2017-01-18 ENCOUNTER — Ambulatory Visit (INDEPENDENT_AMBULATORY_CARE_PROVIDER_SITE_OTHER): Payer: Medicaid Other | Admitting: Psychiatry

## 2017-01-18 DIAGNOSIS — Z8659 Personal history of other mental and behavioral disorders: Secondary | ICD-10-CM

## 2017-01-18 DIAGNOSIS — F419 Anxiety disorder, unspecified: Secondary | ICD-10-CM

## 2017-01-18 DIAGNOSIS — F32A Depression, unspecified: Secondary | ICD-10-CM

## 2017-01-18 DIAGNOSIS — F329 Major depressive disorder, single episode, unspecified: Secondary | ICD-10-CM

## 2017-01-18 MED ORDER — HYDROXYZINE PAMOATE 25 MG CAPSULE
25.00 mg | ORAL_CAPSULE | Freq: Three times a day (TID) | ORAL | 1 refills | Status: AC | PRN
Start: 2017-01-18 — End: 2017-02-17

## 2017-01-18 MED ORDER — LITHIUM CARBONATE 300 MG CAPSULE
300.0000 mg | ORAL_CAPSULE | Freq: Every day | ORAL | 1 refills | Status: DC
Start: 2017-01-18 — End: 2017-04-02

## 2017-01-18 MED ORDER — SERTRALINE 100 MG TABLET
200.0000 mg | ORAL_TABLET | Freq: Every day | ORAL | 2 refills | Status: DC
Start: 2017-01-18 — End: 2017-04-02

## 2017-01-18 NOTE — Progress Notes (Signed)
Behavioral Medicine and Psychiatry   Outpatient Progress Note      Frederick Schultz   [id number]  04/20/1981  01/18/2017    DOS: 01/18/2017    CC: F/u for medication management    SUBJECTIVE:  Patient is a 36 y.o. male who presents to clinic as a follow up for depression. He was last seen by this provider on 10/09/16. Since his last appointment, patient states that he still feels depressed. He felt better when his Zoloft was previously increased but has noticed for the past month that his mood is low quite a few days. He does have good days where he is able to go out and do stuff. He endorsed low energy and low motivation. He reported that he always feels hopeless and is a pessimistic person. He focuses on the bad things instead of the good. As an example, when some one tells him how cute an animal is, all he thinks about it that it will die one day instead of admiring and focusing on the present. He denied any problems with his sleep or appetite.  He denied any feelings of guilt or helplessness and denied active or passive SI. He was agreeable to increase dose of Zoloft for better control of his depression.    Reported side effects: none     MEDICATIONS:    Outpatient Medications Prior to Visit:  acamprosate (CAMPRAL) 333 mg Oral Tablet, Delayed Release (E.C.) Take 2 Tabs (666 mg total) by mouth Three times a day (Patient not taking: Reported on 11/29/2016)   amLODIPine (NORVASC) 10 mg Oral Tablet Take 1 Tab (10 mg total) by mouth Once a day   lithium carbonate (ESKALITH) 300 mg Oral Capsule Take 1 Cap (300 mg total) by mouth Once a day     No facility-administered medications prior to visit.     PHYSICAL:  General: no acute distress, patient sitting comfortably in chair during interview  Neuro: normal gait, no abnormal movements  Skin: no rashes or lesions noted on exposed skin    OBJECTIVE:  Mental Status Exam:   Appearance:  casually dressed, appears actual age and no apparent distress   Behavior:  cooperative and eye  contact  Good   Motor/MS:  normal gait   Speech:  normal   Mood:  euthymic   Affect:  stable   Perception:  normal   Thought:  goal directed/coherent   Thought Content:  appropriate    Suicidal Ideation:  none.     Homicidal Ideation:  none   Level of Consciousness:  alert   Orientation:  grossly normal   Concentration:  good   Memory:  grossly normal   Conceptual:  abstract thinking   Judgement:  good   Insight:  good       Social History:  No changes    ASSESSMENT:  Unspecified depressive disorder.  Unspecified anxiety disorder.  This is a 36 yo M with Unspecified depressive disorder and Unspecified anxiety disorder who is seen by this provider for his depression. Patient reports a history of Bipolar disorder and has been on Lithium 300 mg TID in the past. From detailed history taken from patient, he has never had a manic/hypomanic episode and does not meet criteria for Bipolar disorder. Per patient he was prescribed for mood swings and anger episodes. He is currently on Lithium 300 mg HS which is currently being used as an adjunct to Zoloft for depression in this patient. He is also on Zoloft 150 mg  daily which will be increased to 200 mg daily today.    PLAN:   1. Medications:   - Zoloft 200 mg daily - increased 01/18/17  - Lithium 300 mg qhs as an adjunct for depression with plan to discontinue in the future.  2. Labs: none  3. Therapy: will refer to therapy  4. Safety Concerns: low  5. RTC in 4-6 weeks. Patient advised to call with any questions or concerns.  6. Patient advised to report to nearest emergency department or to call 911 if having any suicidal or homicidal ideations.  7. Patient staffedwith Dr. Abbe Amsterdam.     Armida Sans, MD 01/18/2017, 15:51  [facility] [place]  [address]  [place] [address]  Dept: [phone]  Loc: [phone]       Late entry for 01/18/2017. I saw and examined the  patient.  I reviewed the resident's note.  I agree with the findings and plan of care as documented in the resident's note.  Any exceptions/additions are edited/noted.    Dorothey Baseman, MD

## 2017-02-15 ENCOUNTER — Encounter (HOSPITAL_COMMUNITY): Payer: Self-pay | Admitting: Psychiatry

## 2017-02-26 ENCOUNTER — Telehealth (HOSPITAL_COMMUNITY): Payer: Self-pay

## 2017-02-26 NOTE — Telephone Encounter (Signed)
Reminder Phone Call- New Patient Visit    Attempted to contact patient to [facility]. Patient did not answer. Case Manager left voicemail asking patient to return call.    Dub MikesBenjamin A Ole Lafon, CASE MANAGER  02/26/2017, 10:36

## 2017-02-28 ENCOUNTER — Ambulatory Visit (INDEPENDENT_AMBULATORY_CARE_PROVIDER_SITE_OTHER): Payer: Medicaid Other | Admitting: Professional

## 2017-02-28 DIAGNOSIS — F411 Generalized anxiety disorder: Secondary | ICD-10-CM

## 2017-02-28 DIAGNOSIS — F331 Major depressive disorder, recurrent, moderate: Secondary | ICD-10-CM

## 2017-03-01 ENCOUNTER — Encounter (HOSPITAL_BASED_OUTPATIENT_CLINIC_OR_DEPARTMENT_OTHER): Payer: Medicaid Other | Admitting: Family Medicine
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
## 2017-03-12 ENCOUNTER — Ambulatory Visit (INDEPENDENT_AMBULATORY_CARE_PROVIDER_SITE_OTHER): Payer: Medicaid Other | Admitting: Professional

## 2017-03-12 DIAGNOSIS — F331 Major depressive disorder, recurrent, moderate: Secondary | ICD-10-CM

## 2017-03-12 DIAGNOSIS — F411 Generalized anxiety disorder: Secondary | ICD-10-CM

## 2017-03-18 NOTE — Progress Notes (Signed)
Frederick AltesSean F Beringer   [id number]  06/04/1981  03/12/2017    Chief Complaint   Patient presents with   . Psycho Therapy        Time in: 1353              Time out: 1450    SUBJECTIVE:  Frederick AltesSean F Schultz presents to clinic today per established outpatient therapy treatment plan. Frederick Schultz discussed an [place]mate for a festival. Frederick Schultz discussed that his father cancelled their planned time together due to lack of money because his cat has been sick. Frederick Schultz discussed his life vision and feeling like its ruined due to not being able to go back to school due to a large amount of debt. This provider discussed re-evaluating his life vision and find other ways to fulfill his dream of helping children.     OBJECTIVE:     Orientation: Fully oriented to person, place, time and situation.  Marland Kitchen.     Appearance:  casually dressed.     Eye Contact:  good.     Behavior:  cooperative.     Attention:  good.     Speech:  normal rate and volume.     Motor:  no psychomotor retardation or agitation.     Mood:  anxious.     Affect:  appears anxious.     Thought Process:  goal oriented.     Thought Content:  no paranoia or delusions.     Suicidal Ideation:  none.     Homicidal Ideation:  none   Perception:  no hallucinations endorsed   Cognition:  concrete.     Insight:  fair.     Judgement:  fair.     Goals/wishes:  to develop coping skills for anxiety.     Other objective findings:  none    ASSESSMENT:    GAD    PROCEDURE:  Used supportive dynamic therapy to encourage identification and expression of feeling states.  Reinforced patient's self-efficacy.  Encouraged increased understanding and acceptance of feelings.  Assisted patient in identifying unproductive defense mechanisms with more helpful adaptive coping strategies. Used Dialectical Behavior therapy to decrease emotion dysregulation, learn effective interpersonal relationship skills, and increase crisis survival skills.     Inquired for  suicidal ideation/homicidal ideation. Frederick Schultz does not endorse current SI or HI.  * Frederick Schultz has information and plan for safety and is willing to seek help if SI and/or HI  are present.    PLAN:    Return for outpatient individual therapy bi weekly as per therapy treatment plan.     Latina Craveraitlin M Lalonnie Shaffer, LPC, 03/18/2017, 12:11

## 2017-03-26 ENCOUNTER — Ambulatory Visit (INDEPENDENT_AMBULATORY_CARE_PROVIDER_SITE_OTHER): Payer: Medicaid Other | Admitting: Professional

## 2017-03-26 DIAGNOSIS — F411 Generalized anxiety disorder: Secondary | ICD-10-CM

## 2017-03-26 DIAGNOSIS — F331 Major depressive disorder, recurrent, moderate: Secondary | ICD-10-CM

## 2017-03-28 NOTE — Progress Notes (Signed)
Frederick Schultz F Hetz   [id number]  05/04/1981  03/26/2017    Chief Complaint   Patient presents with   . Psycho Therapy        Time in: 1300              Time out: 1355    SUBJECTIVE:  Frederick Schultz presents to clinic today per established outpatient therapy treatment plan. Frederick Schultz discussed his [place] roommate for a festival. This provider again discussed the idea of re-evaluating his life vision and find other ways to fulfill his dream of helping children. 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     OBJECTIVE:     Orientation: Fully oriented to person, place, time and situation.  Marland Kitchen.     Appearance:  casually dressed.     Eye Contact:  good.     Behavior:  cooperative.     Attention:  good.     Speech:  normal rate and volume.     Motor:  no psychomotor retardation or agitation.     Mood:  anxious.     Affect:  appears anxious.     Thought Process:  goal oriented.     Thought Content:  no paranoia or delusions.     Suicidal Ideation:  none.     Homicidal Ideation:  none   Perception:  no hallucinations endorsed   Cognition:  concrete.     Insight:  fair.     Judgement:  fair.     Goals/wishes:  to develop coping skills for anxiety.     Other objective findings:  none    ASSESSMENT:    GAD    PROCEDURE:  Used supportive dynamic therapy to encourage identification and expression of feeling states.  Reinforced patient's self-efficacy.  Encouraged increased understanding and acceptance of feelings.  Assisted patient in identifying unproductive defense mechanisms with more helpful adaptive coping strategies. Used Dialectical Behavior therapy to decrease emotion dysregulation, learn effective interpersonal relationship skills, and increase crisis survival skills.     Inquired for suicidal ideation/homicidal ideation. Frederick Schultz does not endorse current SI or HI.  * Frederick Schultz has information and plan for safety and is willing to seek help if SI  and/or HI  are present.    PLAN:    Return for outpatient individual therapy bi weekly as per therapy treatment plan.     Latina Craveraitlin M Emma Birchler, LPC, 03/28/2017, 14:50

## 2017-04-02 ENCOUNTER — Ambulatory Visit (INDEPENDENT_AMBULATORY_CARE_PROVIDER_SITE_OTHER): Payer: Medicaid Other | Admitting: Psychiatry

## 2017-04-02 DIAGNOSIS — F329 Major depressive disorder, single episode, unspecified: Principal | ICD-10-CM

## 2017-04-02 DIAGNOSIS — F419 Anxiety disorder, unspecified: Secondary | ICD-10-CM

## 2017-04-02 DIAGNOSIS — F32A Depression, unspecified: Secondary | ICD-10-CM

## 2017-04-02 MED ORDER — LITHIUM CARBONATE 300 MG CAPSULE
300.0000 mg | ORAL_CAPSULE | Freq: Every day | ORAL | 2 refills | Status: DC
Start: 2017-04-02 — End: 2017-06-14

## 2017-04-02 MED ORDER — SERTRALINE 100 MG TABLET
200.0000 mg | ORAL_TABLET | Freq: Every day | ORAL | 2 refills | Status: DC
Start: 2017-04-02 — End: 2017-06-14

## 2017-04-02 MED ORDER — HYDROXYZINE PAMOATE 25 MG CAPSULE
ORAL_CAPSULE | ORAL | 1 refills | Status: DC
Start: 2017-04-02 — End: 2017-09-13

## 2017-04-02 NOTE — Progress Notes (Signed)
gBehavioral Medicine and Psychiatry   Outpatient Progress Note      Shelda AltesSean F Buzzelli   [id number]  09/10/1980  04/02/2017    DOS: 04/02/2017    CC: F/u for medication management    SUBJECTIVE:  Patient is a 36 y.o. male who presents to clinic as a follow up for depression. He was last seen by this provider on 01/18/17.     Patient reported that his mood has been okay since his last appointment.  He continues to get frustrated easily and gently dissatisfied with where his life is heading.  He gets angry at people who make him wait and as well as at people were doing better in their life than he is.  Reports that he struggles with feelings of unfairness and frustration.  He plans to work with his therapist to find out ways to cope with these chronic feelings.  Other than that patient reported that he is stable on his medications.  He Has low mood sometimes but reported that this has been better since he has been stable on Zoloft.  He denied low energy, low motivation, hopelessness, helplessness, guilt, poor appetite.  He reported that sleep is usually a problem for him as he starts thinking about where his life is headed and becomes anxious.  He is unable to control these pessimistic thoughts which as a result affect his sleep on some days.  He takes Vistaril 25 mg p.r.n. for anxiety which helps but has not noticed it to help for sleep whenever he takes at night.  He is agreeable to try taking 50 mg at night for sleep, but otherwise wants to continue the same medication as before.    Reported side effects: none     MEDICATIONS:    Outpatient Medications Prior to Visit:  acamprosate (CAMPRAL) 333 mg Oral Tablet, Delayed Release (E.C.) Take 2 Tabs (666 mg total) by mouth Three times a day (Patient not taking: Reported on 11/29/2016)   amLODIPine (NORVASC) 10 mg Oral Tablet Take 1 Tab (10 mg total) by mouth Once a day   lithium carbonate (ESKALITH) 300 mg Oral Capsule Take 1 Cap (300 mg total) by mouth Once a day for 30 days    sertraline (ZOLOFT) 100 mg Oral Tablet Take 2 Tabs (200 mg total) by mouth Once a day for 30 days     No facility-administered medications prior to visit.     PHYSICAL:  General: no acute distress, patient sitting comfortably in chair during interview  Neuro: normal gait, no abnormal movements  Skin: no rashes or lesions noted on exposed skin    OBJECTIVE:  Mental Status Exam:   Appearance:  casually dressed, appears actual age and no apparent distress   Behavior:  cooperative and eye contact  Good   Motor/MS:  normal gait   Speech:  normal   Mood:  euthymic   Affect:  stable   Perception:  normal   Thought:  goal directed/coherent   Thought Content:  appropriate    Suicidal Ideation:  none.     Homicidal Ideation:  none   Level of Consciousness:  alert   Orientation:  grossly normal   Concentration:  good   Memory:  grossly normal   Conceptual:  abstract thinking   Judgement:  good   Insight:  good       Social History:  No changes    ASSESSMENT:  Unspecified depressive disorder.  Unspecified anxiety disorder.    This is a 36  yo M with Unspecified depressive disorder and Unspecified anxiety disorder who is seen by this provider for his depression. Patient reports a history of Bipolar disorder and has been on Lithium 300 mg TID in the past. From detailed history taken from patient, he has never had a manic/hypomanic episode and does not meet criteria for Bipolar disorder. He is currently on Lithium 300 mg HS which is currently being used as an adjunct to Zoloft for depression in this patient. He is also on Zoloft 200 mg daily and has been stable on these medications.  He takes Vistaril 25 mg p.r.n. for anxiety and was advised to take 50 mg HS p.r.n. for sleep. No safety concerns during this visit.    PLAN:   1. Medications:   - Zoloft 200 mg daily - increased 01/18/17  - Lithium 300 mg qhs as an adjunct for depression with plan to discontinue in the  future.                          - Vistaril 25 mg BID porn for anxiety and 50 mg HS prn for sleep and anxiety.  2. Labs: none  3. Therapy: With Levonne Hubert.  4. Safety Concerns: low  5. RTC in 2 months or sooner if needed. Patient advised to call with any questions or concerns.  6. Patient advised to report to nearest emergency department or to call 911 if having any suicidal or homicidal ideations.  7. Patient staffedwith Dr. Zella Ball.     Armida Sans, MD 04/02/2017, 10:24  [facility] [place]  [address]  [place] [address]  Dept: [phone]  Loc: [phone]     I saw and examined the patient. I was present and participated in the development of the treatment plan. I reviewed the resident's note. I agree with the findings and plan of care as documented in the resident's note.  Any exceptions/ additions are edited/noted.         Roslyn Smiling, MD

## 2017-04-03 ENCOUNTER — Ambulatory Visit: Payer: Medicaid Other | Attending: Family Medicine | Admitting: Family Medicine

## 2017-04-03 ENCOUNTER — Encounter (HOSPITAL_BASED_OUTPATIENT_CLINIC_OR_DEPARTMENT_OTHER): Payer: Self-pay | Admitting: Family Medicine

## 2017-04-03 VITALS — BP 142/98 | HR 85 | Temp 97.0°F | Ht 66.0 in | Wt 213.0 lb

## 2017-04-03 DIAGNOSIS — F319 Bipolar disorder, unspecified: Secondary | ICD-10-CM | POA: Insufficient documentation

## 2017-04-03 DIAGNOSIS — Z6834 Body mass index (BMI) 34.0-34.9, adult: Secondary | ICD-10-CM

## 2017-04-03 DIAGNOSIS — I10 Essential (primary) hypertension: Secondary | ICD-10-CM | POA: Insufficient documentation

## 2017-04-03 MED ORDER — HYDROCHLOROTHIAZIDE 12.5 MG CAPSULE
12.5000 mg | ORAL_CAPSULE | Freq: Every day | ORAL | 4 refills | Status: DC
Start: 2017-04-03 — End: 2017-12-03

## 2017-04-03 NOTE — Progress Notes (Signed)
Subjective:     Patient ID:  Frederick Schultz is an 36 y.o. male   Chief Complaint:    Chief Complaint   Patient presents with   . Hypertension       HPInonsmoker with htn and bipolar - not at goal for htn  But mode ok  Past Medical History:   Diagnosis Date   . Asthma    . Bipolar 1 disorder (CMS HCC)      Past Surgical History:   Procedure Laterality Date   . ABDOMINAL HERNIA REPAIR           Review of Systems no cp or sob or melena or hematochezia  Objective:   Physical Exam BP (!) 142/98 Comment: manual  Pulse 85  Temp 36.1 C (97 F) (Thermal Scan)   Ht 1.676 m (_0 )  Wt 96.6 kg (212 lb 15.4 oz)  SpO2 97%  BMI 34.37 kg/m2  nad  Clear to auscultation no wheezes, heart rrr no murmurs, good upper and lower extremity pulses, no carotid, femoral or abdominal bruits, no thyroid nodules    Good historian  Ortho Exam    Assessment & Plan:     (I10) HTN (hypertension)  (primary encounter diagnosis)  Plan: LITHIUM LEVEL, BASIC METABOLIC PANEL        bp not at goal will restart hctz 12.5 mg and reeval in 2-3 mo  Mom is a nurse and they check bp at home  He can message or call for med change or update    Also ret prn    Konrad Saha, MD

## 2017-04-09 ENCOUNTER — Encounter (HOSPITAL_COMMUNITY): Payer: Self-pay | Admitting: Professional

## 2017-04-16 ENCOUNTER — Ambulatory Visit (INDEPENDENT_AMBULATORY_CARE_PROVIDER_SITE_OTHER): Payer: Medicaid Other | Admitting: Professional

## 2017-04-16 DIAGNOSIS — F411 Generalized anxiety disorder: Secondary | ICD-10-CM

## 2017-04-16 DIAGNOSIS — F331 Major depressive disorder, recurrent, moderate: Secondary | ICD-10-CM

## 2017-04-17 NOTE — Progress Notes (Signed)
Frederick Schultz Sieg   [id number]  04/25/1981  04/16/2017    Chief Complaint   Patient presents with   . Psycho Therapy        Time in: 1207              Time out: 1301    SUBJECTIVE:  Frederick Schultz Daye presents to clinic today per established outpatient therapy treatment plan. Gregary SignsSean discussed his trip to [place] and his relationship with his roommate. Gregary SignsSean discussed his inability to find motivation to complete tasks and his tendency to give up before trying. 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     OBJECTIVE:     Orientation: Fully oriented to person, place, time and situation.  Marland Kitchen.     Appearance:  casually dressed.     Eye Contact:  good.     Behavior:  cooperative.     Attention:  good.     Speech:  normal rate and volume.     Motor:  no psychomotor retardation or agitation.     Mood:  anxious.     Affect:  appears anxious.     Thought Process:  goal oriented.     Thought Content:  no paranoia or delusions.     Suicidal Ideation:  none.     Homicidal Ideation:  none   Perception:  no hallucinations endorsed   Cognition:  concrete.     Insight:  fair.     Judgement:  fair.     Goals/wishes:  to develop coping skills for anxiety.     Other objective findings:  none    ASSESSMENT:    GAD    PROCEDURE:  Used supportive dynamic therapy to encourage identification and expression of feeling states.  Reinforced patient's self-efficacy.  Encouraged increased understanding and acceptance of feelings.  Assisted patient in identifying unproductive defense mechanisms with more helpful adaptive coping strategies. Used Dialectical Behavior therapy to decrease emotion dysregulation, learn effective interpersonal relationship skills, and increase crisis survival skills.     Inquired for suicidal ideation/homicidal ideation. Frederick Schultz Hannibal does not endorse current SI or HI.  * Frederick Schultz Gosse has information and plan  for safety and is willing to seek help if SI and/or HI  are present.    PLAN:    Return for outpatient individual therapy bi weekly as per therapy treatment plan.     Latina Craveraitlin M Karsyn Rochin, LPC, 04/17/2017, 11:48

## 2017-05-01 ENCOUNTER — Ambulatory Visit (INDEPENDENT_AMBULATORY_CARE_PROVIDER_SITE_OTHER): Payer: Medicaid Other | Admitting: Professional

## 2017-05-01 DIAGNOSIS — F331 Major depressive disorder, recurrent, moderate: Secondary | ICD-10-CM

## 2017-05-01 DIAGNOSIS — F411 Generalized anxiety disorder: Secondary | ICD-10-CM

## 2017-05-02 NOTE — Progress Notes (Signed)
Frederick Schultz   [id number]  1980/10/03  05/01/2017    Chief Complaint   Patient presents with   . Psycho Therapy        Time in: 1154              Time out: 1250    SUBJECTIVE:  Frederick Schultz presents to clinic today per established outpatient therapy treatment plan. 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 Session focused on him building a life worth living and adjusting his goals. Frederick Schultz discussed his anger he has towards students at [employer] and how they are a reminder of his "Failure". Frederick Schultz and will add weekly exercise to his routine.     OBJECTIVE:     Orientation: Fully oriented to person, place, time and situation.  Frederick Schultz Kitchen     Appearance:  casually dressed.     Eye Contact:  good.     Behavior:  cooperative.     Attention:  good.     Speech:  normal rate and volume.     Motor:  no psychomotor retardation or agitation.     Mood:  anxious.     Affect:  appears anxious.     Thought Process:  goal oriented.     Thought Content:  no paranoia or delusions.     Suicidal Ideation:  none.     Homicidal Ideation:  none   Perception:  no hallucinations endorsed   Cognition:  concrete.     Insight:  fair.     Judgement:  fair.     Goals/wishes:  to develop coping skills for anxiety.     Other objective findings:  none    ASSESSMENT:    GAD    PROCEDURE:  Used supportive dynamic therapy to encourage identification and expression of feeling states.  Reinforced patient's self-efficacy.  Encouraged increased understanding and acceptance of feelings.  Assisted patient in identifying unproductive defense mechanisms with more helpful adaptive coping strategies. Used Dialectical Behavior therapy to decrease emotion dysregulation, learn effective  interpersonal relationship skills, and increase crisis survival skills.     Inquired for suicidal ideation/homicidal ideation. Frederick Schultz does not endorse current SI or HI.  * Frederick Schultz has information and plan for safety and is willing to seek help if SI and/or HI  are present.    PLAN:    Return for outpatient individual therapy bi weekly as per therapy treatment plan.     Latina Craver, LPC, 05/02/2017, 10:12

## 2017-05-16 ENCOUNTER — Ambulatory Visit (INDEPENDENT_AMBULATORY_CARE_PROVIDER_SITE_OTHER): Payer: Medicaid Other | Admitting: Professional

## 2017-05-16 DIAGNOSIS — F331 Major depressive disorder, recurrent, moderate: Secondary | ICD-10-CM

## 2017-05-16 DIAGNOSIS — F411 Generalized anxiety disorder: Secondary | ICD-10-CM

## 2017-05-20 NOTE — Progress Notes (Signed)
Frederick Schultz Staley   [id number]  08/06/1981  05/16/2017    Chief Complaint   Patient presents with   . Psycho Therapy        Time in: 1106              Time out: 1155    SUBJECTIVE:  Frederick Schultz Akers presents to clinic today per established outpatient therapy treatment plan. 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 Gregary SignsSean reports not being able to add exercise into his daily routine but is going to try this time.      OBJECTIVE:     Orientation: Fully oriented to person, place, time and situation.  Marland Kitchen.     Appearance:  casually dressed.     Eye Contact:  good.     Behavior:  cooperative.     Attention:  good.     Speech:  normal rate and volume.     Motor:  no psychomotor retardation or agitation.     Mood:  anxious.     Affect:  appears anxious.     Thought Process:  goal oriented.     Thought Content:  no paranoia or delusions.     Suicidal Ideation:  none.     Homicidal Ideation:  none   Perception:  no hallucinations endorsed   Cognition:  concrete.     Insight:  fair.     Judgement:  fair.     Goals/wishes:  to develop coping skills for anxiety.     Other objective findings:  none    ASSESSMENT:    GAD    PROCEDURE:  Used supportive dynamic therapy to encourage identification and expression of feeling states.  Reinforced patient's self-efficacy.  Encouraged increased understanding and acceptance of feelings.  Assisted patient in identifying unproductive defense mechanisms with more helpful adaptive coping strategies. Used Dialectical Behavior therapy to decrease emotion dysregulation, learn effective interpersonal relationship skills, and increase crisis survival skills.     Inquired for suicidal ideation/homicidal ideation. Frederick Schultz Sedore does not endorse current SI or HI.  * Frederick Schultz Moser has information and plan for safety and is  willing to seek help if SI and/or HI  are present.    PLAN:    Return for outpatient individual therapy bi weekly as per therapy treatment plan.     Latina Craveraitlin M Maheen Cwikla, LPC, 05/20/2017, 14:23

## 2017-05-29 ENCOUNTER — Ambulatory Visit (INDEPENDENT_AMBULATORY_CARE_PROVIDER_SITE_OTHER): Payer: Medicaid Other | Admitting: Professional

## 2017-05-29 DIAGNOSIS — F411 Generalized anxiety disorder: Secondary | ICD-10-CM

## 2017-05-29 DIAGNOSIS — F331 Major depressive disorder, recurrent, moderate: Secondary | ICD-10-CM

## 2017-06-04 ENCOUNTER — Ambulatory Visit: Payer: Medicaid Other | Attending: Family Medicine | Admitting: Family Medicine

## 2017-06-04 VITALS — BP 124/88 | HR 95 | Temp 96.8°F | Ht 66.0 in | Wt 215.6 lb

## 2017-06-04 DIAGNOSIS — I10 Essential (primary) hypertension: Secondary | ICD-10-CM | POA: Insufficient documentation

## 2017-06-04 DIAGNOSIS — Z79899 Other long term (current) drug therapy: Secondary | ICD-10-CM | POA: Insufficient documentation

## 2017-06-04 DIAGNOSIS — E785 Hyperlipidemia, unspecified: Secondary | ICD-10-CM | POA: Insufficient documentation

## 2017-06-04 DIAGNOSIS — Z6834 Body mass index (BMI) 34.0-34.9, adult: Secondary | ICD-10-CM

## 2017-06-04 DIAGNOSIS — M12811 Other specific arthropathies, not elsewhere classified, right shoulder: Secondary | ICD-10-CM | POA: Insufficient documentation

## 2017-06-04 DIAGNOSIS — M109 Gout, unspecified: Secondary | ICD-10-CM | POA: Insufficient documentation

## 2017-06-04 DIAGNOSIS — Z23 Encounter for immunization: Secondary | ICD-10-CM | POA: Insufficient documentation

## 2017-06-04 DIAGNOSIS — M542 Cervicalgia: Secondary | ICD-10-CM | POA: Insufficient documentation

## 2017-06-04 DIAGNOSIS — F319 Bipolar disorder, unspecified: Secondary | ICD-10-CM | POA: Insufficient documentation

## 2017-06-04 NOTE — Nursing Note (Signed)
1. Are you 36 years of age or older? yes  3. Have you ever had a severe reaction to a flu shot? no  4. Are you allergic to eggs? no  5. Are you allergic to latex? no  6. Are you allergic to Thimerosol? no  7. Are you experiencing acute illness symptoms or have you been running a fever? no  8. Do you have a medical condition or taking medications that suppress your immune system? no  9. Have you ever had Guillain-Barre syndrome or other neurologic disorder? no  10. Are you pregnant or breastfeeding? no    Immunization administered     Name Date Dose VIS Date Route    INFLUENZA VACCINE IM 06/04/2017 0.5 mL 04/16/2014 Intramuscular    Site: Left deltoid    Given By: Antonietta Breach, LPN    Manufacturer: [employer]    Lot: [id number]    NDC: [id number]          Antonietta Breach, LPN 6/64/4034, 74:25

## 2017-06-04 NOTE — Progress Notes (Signed)
Subjective:     Patient ID:  Frederick Schultz is an 36 y.o. male   Chief Complaint:    Chief Complaint   Patient presents with   . Medication Check   . Hypertension       HPI nonsmoker with htn (better) hld gout (once) neck and right shoulder pain mild to mod  sev weeks- needs flu shot  Happy with bp improvement - he is gaining a little weight  Past Medical History:   Diagnosis Date   . Asthma    . Bipolar 1 disorder (CMS HCC)      Past Surgical History:   Procedure Laterality Date   . ABDOMINAL HERNIA REPAIR           Review of Systems no cp or sob or bleeding  Objective:   Physical Exam BP 124/88  Pulse 95  Temp 36 C (96.8 F) (Thermal Scan)   Ht 1.676 m ( )  Wt 97.8 kg (215 lb 9.8 oz)  SpO2 98%  BMI 34.8 kg/m2  nad  cta rrr right paracervical and trapezius and rotator cuff pain  Supple abd     Good pulses no murmur  Good historian  Ortho Exam    Assessment & Plan:     (I10) HTN (hypertension)  (primary encounter diagnosis)  Plan: BASIC METABOLIC PANEL        better    (E78.5) Hyperlipidemia, unspecified hyperlipidemia type  Plan: LIPIDS Standing Order every 3 months and PRN            (M10.9) Gout, unspecified cause, unspecified chronicity, unspecified site  Plan: URIC ACID        Dietary change discussed    (M54.2) Neck pain  Plan: physical rx    (M12.811) Rotator cuff arthropathy, right  Plan: Refer to Physical Therapy-EXTERNAL         Received flu shot     rtc 6 mo or prn

## 2017-06-04 NOTE — Patient Instructions (Signed)
2018-2019 Vaccine Information Statement    Influenza (Flu) Vaccine (Inactivated or Recombinant): What you need to know    Many Vaccine Information Statements are available in Spanish and other languages. See www.immunize.org/vis  Hojas de Informacin Sobre Vacunas estn disponibles en Espaol y en muchos otros idiomas. Visite www.immunize.org/vis    1. Why get vaccinated?    Influenza ("flu") is a contagious disease that spreads around the [place] every year, usually between October and May.     Flu is caused by influenza viruses, and is spread mainly by coughing, sneezing, and close contact.     Anyone can get flu. Flu strikes suddenly and can last several days. Symptoms vary by age, but can include:  o fever/chills  o sore throat  o muscle aches  o fatigue  o cough  o headache   o runny or stuffy nose    Flu can also lead to pneumonia and blood infections, and cause diarrhea and seizures in children.  If you have a medical condition, such as heart or lung disease, flu can make it worse.    Flu is more dangerous for some people. Infants and young children, people 65 years of age and older, pregnant women, and people with certain health conditions or a weakened immune system are at greatest risk.      Each year thousands of people in the [place] die from flu, and many more are hospitalized.     Flu vaccine can:  o keep you from getting flu,  o make flu less severe if you do get it, and  o keep you from spreading flu to your family and other people.     2. Inactivated and recombinant flu vaccines    A dose of flu vaccine is recommended every flu season. Children 6 months through 8 years of age may need two doses during the same flu season.  Everyone else needs only one dose each flu season.       Some inactivated flu vaccines contain a very small amount of a mercury-based preservative called thimerosal. Studies have not shown thimerosal in vaccines to be harmful, but flu vaccines that do not contain  thimerosal are available.    There is no live flu virus in flu shots.  They cannot cause the flu.     There are many flu viruses, and they are always changing. Each year a new flu vaccine is made to protect against three or four viruses that are likely to cause disease in the upcoming flu season. But even when the vaccine doesn't exactly match these viruses, it may still provide some protection    Flu vaccine cannot prevent:  o flu that is caused by a virus not covered by the vaccine, or  o illnesses that look like flu but are not.    It takes about 2 weeks for protection to develop after vaccination, and protection lasts through the flu season.     3. Some people should not get this vaccine    Tell the person who is giving you the vaccine:    o If you have any severe, life-threatening allergies.    If you ever had a life-threatening allergic reaction after a dose of flu vaccine, or have a severe allergy to any part of this vaccine, you may be advised not to get vaccinated.  Most, but not all, types of flu vaccine contain a small amount of egg protein.       o If   you ever had Guillain-Barr Syndrome (also called GBS).   Some people with a history of GBS should not get this vaccine. This should be discussed with your doctor.    o If you are not feeling well.    It is usually okay to get flu vaccine when you have a mild illness, but you might be asked to come back when you feel better.      4. Risks of a vaccine reaction     With any medicine, including vaccines, there is a chance of reactions. These are usually mild and go away on their own, but serious reactions are also possible.     Most people who get a flu shot do not have any problems with it.     Minor problems following a flu shot include:   o soreness, redness, or swelling where the shot was given    o hoarseness  o sore, red or itchy eyes  o cough  o fever  o aches  o headache  o itching  o fatigue  If these problems occur, they usually begin soon after the  shot and last 1 or 2 days.     More serious problems following a flu shot can include the following:    o There may be a small increased risk of Guillain-Barr Syndrome (GBS) after inactivated flu vaccine.  This risk has been estimated at 1 or 2 additional cases per million people vaccinated. This is much lower than the risk of severe complications from flu, which can be prevented by flu vaccine.      o Young children who get the flu shot along with pneumococcal vaccine (PCV13) and/or DTaP vaccine at the same time might be slightly more likely to have a seizure caused by fever. Ask your doctor for more information. Tell your doctor if a child who is getting flu vaccine has ever had a seizure.     Problems that could happen after any injected vaccine:     o People sometimes faint after a medical procedure, including vaccination. Sitting or lying down for about 15 minutes can help prevent fainting, and injuries caused by a fall. Tell your doctor if you feel dizzy, or have vision changes or ringing in the ears.    o Some people get severe pain in the shoulder and have difficulty moving the arm where a shot was given. This happens very rarely.    o Any medication can cause a severe allergic reaction. Such reactions from a vaccine are very rare, estimated at about 1 in a million doses, and would happen within a few minutes to a few hours after the vaccination.    As with any medicine, there is a very remote chance of a vaccine causing a serious injury or death.    The safety of vaccines is always being monitored. For more information, visit: www.cdc.gov/vaccinesafety/    5. What if there is a serious reaction?    What should I look for?    o Look for anything that concerns you, such as signs of a severe allergic reaction, very high fever, or unusual behavior.    Signs of a severe allergic reaction can include hives, swelling of the face and throat, difficulty breathing, a fast heartbeat, dizziness, and weakness - usually  within a few minutes to a few hours after the vaccination.    What should I do?    o If you think it is a severe allergic reaction or other   emergency that can't wait, call 9-1-1 and get the person to the nearest hospital. Otherwise, call your doctor.    o Reactions should be reported to the Vaccine Adverse Event Reporting System (VAERS). Your doctor should file this report, or you can do it yourself through  the VAERS web site at www.vaers.hhs.gov, or by calling 1-800-822-7967.    VAERS does not give medical advice.    6. The National Vaccine Injury Compensation Program    The National Vaccine Injury Compensation Program (VICP) is a federal program that was created to compensate people who may have been injured by certain vaccines.    Persons who believe they may have been injured by a vaccine can learn about the program and about filing a claim by calling 1-800-338-2382 or visiting the VICP website at www.hrsa.gov/vaccinecompensation.  There is a time limit to file a claim for compensation.    7. How can I learn more?  o Ask your healthcare provider. He or she can give you the vaccine package insert or suggest other sources of information.  o Call your local or state health department.  o Contact the Centers for Disease Control and Prevention (CDC):  - Call 1-800-232-4636 (1-800-CDC-INFO) or  - Visit CDC's website at www.cdc.gov/flu    Vaccine Information Statement   Inactivated Influenza Vaccine   04/16/2014  42 U.S.C.  [id number]    Department of Health and Human Services  Centers for Disease Control and Prevention    Office Use Only

## 2017-06-05 NOTE — Progress Notes (Signed)
ALP GOLDWATER   [id number]  [birth year]  05/29/2017    Chief Complaint   Patient presents with    Psycho Therapy        Time in: 1401              Time out: 1455    SUBJECTIVE:  UCHECHUKWU DHAWAN presents to clinic today per established outpatient therapy treatment plan. Mukhtar discussed his anxiousness over waiting on a response about the short story he submitted for publication. Ellen discussed his racing thoughts and needing to shut down his mind in order to better function.     OBJECTIVE:     Orientation: Fully oriented to person, place, time and situation.  Marland Kitchen     Appearance:  casually dressed.     Eye Contact:  good.     Behavior:  cooperative.     Attention:  good.     Speech:  normal rate and volume.     Motor:  no psychomotor retardation or agitation.     Mood:  anxious.     Affect:  appears anxious.     Thought Process:  goal oriented.     Thought Content:  no paranoia or delusions.     Suicidal Ideation:  none.     Homicidal Ideation:  none   Perception:  no hallucinations endorsed   Cognition:  concrete.     Insight:  fair.     Judgement:  fair.     Goals/wishes:  to develop coping skills for anxiety.     Other objective findings:  none    ASSESSMENT:    GAD    PROCEDURE:  Used supportive dynamic therapy to encourage identification and expression of feeling states.  Reinforced patient's self-efficacy.  Encouraged increased understanding and acceptance of feelings.  Assisted patient in identifying unproductive defense mechanisms with more helpful adaptive coping strategies. Used Dialectical Behavior therapy to decrease emotion dysregulation, learn effective interpersonal relationship skills, and increase crisis survival skills.     Inquired for suicidal ideation/homicidal ideation. BRIGIDO MERA does not endorse current SI or HI.  * RYVER POBLETE has information and plan for safety and is willing to seek help if SI and/or HI  are present.    PLAN:    Return for outpatient individual therapy bi weekly as  per therapy treatment plan.     Latina Craver, LPC, 06/05/2017, 12:34

## 2017-06-11 ENCOUNTER — Encounter (HOSPITAL_COMMUNITY): Payer: Self-pay | Admitting: Psychiatry

## 2017-06-14 ENCOUNTER — Encounter (HOSPITAL_COMMUNITY): Payer: Self-pay | Admitting: Professional

## 2017-06-14 ENCOUNTER — Ambulatory Visit (INDEPENDENT_AMBULATORY_CARE_PROVIDER_SITE_OTHER): Payer: Medicaid Other | Admitting: Psychiatry

## 2017-06-14 ENCOUNTER — Ambulatory Visit (INDEPENDENT_AMBULATORY_CARE_PROVIDER_SITE_OTHER): Payer: Medicaid Other | Admitting: Professional

## 2017-06-14 DIAGNOSIS — F329 Major depressive disorder, single episode, unspecified: Secondary | ICD-10-CM

## 2017-06-14 DIAGNOSIS — F411 Generalized anxiety disorder: Secondary | ICD-10-CM

## 2017-06-14 DIAGNOSIS — F32A Depression, unspecified: Secondary | ICD-10-CM

## 2017-06-14 DIAGNOSIS — F331 Major depressive disorder, recurrent, moderate: Secondary | ICD-10-CM

## 2017-06-14 DIAGNOSIS — F419 Anxiety disorder, unspecified: Secondary | ICD-10-CM

## 2017-06-14 MED ORDER — MIRTAZAPINE 7.5 MG TABLET: 8 mg | Tab | Freq: Every evening | ORAL | 3 refills | 0 days | Status: AC

## 2017-06-14 MED ORDER — SERTRALINE 100 MG TABLET
200.0000 mg | ORAL_TABLET | Freq: Every day | ORAL | 3 refills | Status: DC
Start: 2017-06-14 — End: 2017-09-13

## 2017-06-14 NOTE — Progress Notes (Signed)
Behavioral Medicine and Psychiatry   Outpatient Progress Note      Frederick Schultz   [id number]  09/05/1981  06/14/2017    DOS: 06/14/2017    CC: F/u for medication management    SUBJECTIVE:  Patient is a 36 y.o. male who presents to clinic as a follow up for depression. He was last seen by this provider on 01/18/17.     Patient reported that he has been doing well but continues to have some anxiety symptoms. He continues to have trouble concentrating and poor sleep. Also mentioned racing thoughts due to anxiety which make it hard to fall asleep. Other than this patient reported his depression has improved significantly since starting Zoloft. He denied low mood, anhedonia, poor energy, hopelessness, helplessness or problems with sleep or appetite. Patient was agreeable to discontinue Lithium today since depression is under control. He was also agreeable to start Remeron to help with sleep and anxiety symptoms. He denied SI, HI or AVH today. No other concerns.       Reported side effects: none     MEDICATIONS:    Outpatient Medications Prior to Visit:  acamprosate (CAMPRAL) 333 mg Oral Tablet, Delayed Release (E.C.) Take 2 Tabs (666 mg total) by mouth Three times a day (Patient not taking: Reported on 11/29/2016)   amLODIPine (NORVASC) 10 mg Oral Tablet Take 1 Tab (10 mg total) by mouth Once a day   hydroCHLOROthiazide (MICROZIDE) 12.5 mg Oral Capsule Take 1 Cap (12.5 mg total) by mouth Once a day   hydrOXYzine pamoate (VISTARIL) 25 mg Oral Capsule Take 1 pill twice a day as needed for anxiety. Take 2 pills at night as needed for anxiety and sleep.   lithium carbonate (ESKALITH) 300 mg Oral Capsule Take 1 Cap (300 mg total) by mouth Once a day for 30 days   sertraline (ZOLOFT) 100 mg Oral Tablet Take 2 Tabs (200 mg total) by mouth Once a day for 30 days     No facility-administered medications prior to visit.     PHYSICAL:  General: no acute distress, patient sitting comfortably in chair during interview  Neuro: normal gait, no  abnormal movements  Skin: no rashes or lesions noted on exposed skin    OBJECTIVE:  Mental Status Exam:   Appearance:  casually dressed, appears actual age and no apparent distress   Behavior:  cooperative and eye contact  Good   Motor/MS:  normal gait   Speech:  normal   Mood:  euthymic   Affect:  stable   Perception:  normal   Thought:  goal directed/coherent   Thought Content:  appropriate    Suicidal Ideation:  none.     Homicidal Ideation:  none   Level of Consciousness:  alert   Orientation:  grossly normal   Concentration:  good   Memory:  grossly normal   Conceptual:  abstract thinking   Judgement:  good   Insight:  good       Social History:  No changes    ASSESSMENT:  Unspecified depressive disorder.  Unspecified anxiety disorder.    This is a 36 yo M with Unspecified depressive disorder and Unspecified anxiety disorder who is seen by this provider for his depression. Patient reports a history of Bipolar disorder and has been on Lithium 300 mg TID in the past. From detailed history taken from patient, he has never had a manic/hypomanic episode and does not meet criteria for Bipolar disorder. He is currently stable on Zoloft  and Lithium 300 mg HS which is currently being used as an adjunct to Zoloft for depression in this patient. Will discontinue lithium today as depression is under control. He takes Vistaril 25 mg p.r.n. for anxiety but this has not been helpful for sleep. Will start Remeron today for sleep. No safety concerns during this visit.    PLAN:   1. Medications:   - Zoloft 200 mg daily - increased 01/18/17    - Start Remeron 7.5 mg HS for sleep  - Discontinue Lithium 300 mg qhs..                          - Vistaril 25 mg BID prn for anxiety.  2. Labs: none  3. Therapy: With Levonne Hubert.  4. Safety Concerns: low  5. RTC in 2 months or sooner if needed. Patient advised to call with any questions or concerns.  6. Patient advised  to report to nearest emergency department or to call 911 if having any suicidal or homicidal ideations.  7. Patient staffedwith Dr. Abbe Amsterdam.     Armida Sans, MD 06/14/2017, 13:23  [facility] [place]  [address]  [place] [address]  Dept: [phone]  Loc: [phone]       Late entry for 06/14/2017. I saw and examined the patient.  I reviewed the resident's note.  I agree with the findings and plan of care as documented in the resident's note.  Any exceptions/additions are edited/noted.    Dorothey Baseman, MD

## 2017-06-20 NOTE — Progress Notes (Signed)
BRONSYN SHAPPELL   [id number]  13-Jul-1981  06/14/2017    Chief Complaint   Patient presents with   . Psycho Therapy        Time in: 1055              Time out: 1150    SUBJECTIVE:  KALMEN LOLLAR presents to clinic today per established outpatient therapy treatment plan. Adolpho discussed his anxiousness over waiting on a response about the short story he submitted for publication and an email he sent asking for an update. Bayden discussed his recent interview and rejection from [employer] for employment. Enrigue continues to express concern about his inability to find employment and help his roommate out with bills.     OBJECTIVE:     Orientation: Fully oriented to person, place, time and situation.  Marland Kitchen     Appearance:  casually dressed.     Eye Contact:  good.     Behavior:  cooperative.     Attention:  good.     Speech:  normal rate and volume.     Motor:  no psychomotor retardation or agitation.     Mood:  anxious.     Affect:  appears anxious.     Thought Process:  goal oriented.     Thought Content:  no paranoia or delusions.     Suicidal Ideation:  none.     Homicidal Ideation:  none   Perception:  no hallucinations endorsed   Cognition:  concrete.     Insight:  fair.     Judgement:  fair.     Goals/wishes:  to develop coping skills for anxiety.     Other objective findings:  none    ASSESSMENT:    GAD    PROCEDURE:  Used supportive dynamic therapy to encourage identification and expression of feeling states.  Reinforced patient's self-efficacy.  Encouraged increased understanding and acceptance of feelings.  Assisted patient in identifying unproductive defense mechanisms with more helpful adaptive coping strategies. Used Dialectical Behavior therapy to decrease emotion dysregulation, learn effective interpersonal relationship skills, and increase crisis survival skills.     Inquired for suicidal ideation/homicidal ideation. STRYKER VEASEY does not endorse current SI or HI.  * DEEJAY KOPPELMAN has information and plan  for safety and is willing to seek help if SI and/or HI  are present.    PLAN:    Return for outpatient individual therapy bi weekly as per therapy treatment plan.     Latina Craver, LPC, 06/20/2017, 10:55

## 2017-07-01 ENCOUNTER — Ambulatory Visit (INDEPENDENT_AMBULATORY_CARE_PROVIDER_SITE_OTHER): Payer: Medicaid Other | Admitting: Professional

## 2017-07-01 DIAGNOSIS — F411 Generalized anxiety disorder: Secondary | ICD-10-CM

## 2017-07-01 DIAGNOSIS — F331 Major depressive disorder, recurrent, moderate: Secondary | ICD-10-CM

## 2017-07-01 NOTE — Progress Notes (Signed)
Frederick Schultz   [id number]  10/21/1980  07/01/2017    Chief Complaint   Patient presents with   . Psycho Therapy        Time in: 1400              Time out: 1440    SUBJECTIVE:  Frederick Schultz presents to clinic today per established outpatient therapy treatment plan. Gregary SignsSean discussed his anxiousness over waiting on a response about the short story he submitted for publication and an email he sent asking for an update. Gregary SignsSean discussed his plan to apply for disability in March since he will have been unemployed for 1 year then. Gregary SignsSean discussed his Depression over his upcoming birthday on [birth year]. He is certain that he will consume a large amount of alcohol to cope with the idea of getting older and not accomplishing anything. We discussed other ways in which he can cope. Gregary SignsSean reports they have been going to the gym two days per week.     OBJECTIVE:     Orientation: Fully oriented to person, place, time and situation.  Marland Kitchen.     Appearance:  casually dressed.     Eye Contact:  good.     Behavior:  cooperative.     Attention:  good.     Speech:  normal rate and volume.     Motor:  no psychomotor retardation or agitation.     Mood:  anxious.     Affect:  appears anxious.     Thought Process:  goal oriented.     Thought Content:  no paranoia or delusions.     Suicidal Ideation:  none.     Homicidal Ideation:  none   Perception:  no hallucinations endorsed   Cognition:  concrete.     Insight:  fair.     Judgement:  fair.     Goals/wishes:  to develop coping skills for anxiety.     Other objective findings:  none    ASSESSMENT:    GAD    PROCEDURE:  Used supportive dynamic therapy to encourage identification and expression of feeling states.  Reinforced patient's self-efficacy.  Encouraged increased understanding and acceptance of feelings.  Assisted patient in identifying unproductive defense mechanisms with more helpful adaptive coping strategies. Used Dialectical Behavior therapy to decrease emotion dysregulation,  learn effective interpersonal relationship skills, and increase crisis survival skills.     Inquired for suicidal ideation/homicidal ideation. Frederick AltesSean F Schultz does not endorse current SI or HI.  * Frederick Schultz has information and plan for safety and is willing to seek help if SI and/or HI  are present.    PLAN:    Return for outpatient individual therapy bi weekly as per therapy treatment plan.     Latina Craveraitlin M Alianis Trimmer, LPC, 07/01/2017, 14:43

## 2017-07-17 ENCOUNTER — Ambulatory Visit (INDEPENDENT_AMBULATORY_CARE_PROVIDER_SITE_OTHER): Payer: Medicaid Other | Admitting: Professional

## 2017-07-17 DIAGNOSIS — F331 Major depressive disorder, recurrent, moderate: Secondary | ICD-10-CM

## 2017-07-17 DIAGNOSIS — F411 Generalized anxiety disorder: Secondary | ICD-10-CM

## 2017-07-22 NOTE — Progress Notes (Signed)
Shelda AltesSean F Ornstein   [id number]  10/07/1980  07/17/2017    Chief Complaint   Patient presents with   . Psycho Therapy        Time in: 1414              Time out: 1500    SUBJECTIVE:  Shelda AltesSean F Olivos presents to clinic today per established outpatient therapy treatment plan. Gregary SignsSean discussed his current state of waiting for Disability and the frustration his roommate has about paying for everything. We discussed the DBT skill of building a life worth living and if he is able to work then he should try. Gregary SignsSean discussed his birthday and how he celebrated.     OBJECTIVE:     Orientation: Fully oriented to person, place, time and situation.  Marland Kitchen.     Appearance:  casually dressed.     Eye Contact:  good.     Behavior:  cooperative.     Attention:  good.     Speech:  normal rate and volume.     Motor:  no psychomotor retardation or agitation.     Mood:  anxious.     Affect:  appears anxious.     Thought Process:  goal oriented.     Thought Content:  no paranoia or delusions.     Suicidal Ideation:  none.     Homicidal Ideation:  none   Perception:  no hallucinations endorsed   Cognition:  concrete.     Insight:  fair.     Judgement:  fair.     Goals/wishes:  to develop coping skills for anxiety.     Other objective findings:  none    ASSESSMENT:    GAD    PROCEDURE:  Used supportive dynamic therapy to encourage identification and expression of feeling states.  Reinforced patient's self-efficacy.  Encouraged increased understanding and acceptance of feelings.  Assisted patient in identifying unproductive defense mechanisms with more helpful adaptive coping strategies. Used Dialectical Behavior therapy to decrease emotion dysregulation, learn effective interpersonal relationship skills, and increase crisis survival skills.     Inquired for suicidal ideation/homicidal ideation. Shelda AltesSean F Jewel does not endorse current SI or HI.  * Shelda AltesSean F Bartolotta has information and plan for safety and is willing to seek help if SI and/or HI  are  present.    PLAN:    Return for outpatient individual therapy bi weekly as per therapy treatment plan.     Latina Craveraitlin M Ashunti Schofield, LPC, 07/22/2017, 13:25

## 2017-08-05 ENCOUNTER — Encounter (HOSPITAL_COMMUNITY): Payer: Self-pay | Admitting: Professional

## 2017-08-13 ENCOUNTER — Ambulatory Visit (INDEPENDENT_AMBULATORY_CARE_PROVIDER_SITE_OTHER): Payer: Medicaid Other | Admitting: Professional

## 2017-08-13 DIAGNOSIS — F331 Major depressive disorder, recurrent, moderate: Secondary | ICD-10-CM

## 2017-08-13 DIAGNOSIS — F329 Major depressive disorder, single episode, unspecified: Secondary | ICD-10-CM

## 2017-08-26 NOTE — Progress Notes (Signed)
Frederick AltesSean F Schultz   [id number]  07/19/1981  08/13/2017    Chief Complaint   Patient presents with   . Psycho Therapy        Time in: 1400               Time out:  1453    SUBJECTIVE:  Frederick Schultz presents to clinic today per established outpatient therapy treatment plan. Frederick Schultz discussed his Thanksgiving holiday with his mother and brother. Frederick Schultz discussed his job [employer] and possibility of Disability. Frederick Schultz discussed his relationship with his roommate and how to improve their communication.     OBJECTIVE:     Orientation: Fully oriented to person, place, time and situation.  Marland Kitchen.     Appearance:  casually dressed.     Eye Contact:  good.     Behavior:  cooperative.     Attention:  good.     Speech:  normal rate and volume.     Motor:  no psychomotor retardation or agitation.     Mood:  anxious.     Affect:  appears anxious.     Thought Process:  goal oriented.     Thought Content:  no paranoia or delusions.     Suicidal Ideation:  none.     Homicidal Ideation:  none   Perception:  no hallucinations endorsed   Cognition:  concrete and some abstract ability.     Insight:  fair.     Judgement:  fair.     Goals/wishes:  develop coping skills for anxiety.     Other objective findings:  none    ASSESSMENT:    MDD    PROCEDURE:  Used supportive dynamic therapy to encourage identification and expression of feeling states.  Reinforced patient's self-efficacy.  Encouraged increased understanding and acceptance of feelings.  Assisted patient in identifying unproductive defense mechanisms with more helpful adaptive coping strategies. Used Dialectical Behavior therapy to decrease emotion dysregulation, learn effective interpersonal relationship skills, and increase crisis survival skills.     Inquired for suicidal ideation/homicidal ideation. Frederick Schultz does not endorse current SI or HI.  * Frederick Schultz has information and plan for safety and is willing to seek help if SI and/or HI  are present.    PLAN:    Return for  outpatient individual therapy biweekly as per therapy treatment plan.     Latina Craveraitlin M Nailani Full, LPC, 08/26/2017, 12:53

## 2017-08-28 ENCOUNTER — Ambulatory Visit (INDEPENDENT_AMBULATORY_CARE_PROVIDER_SITE_OTHER): Payer: Medicaid Other | Admitting: Professional

## 2017-08-28 DIAGNOSIS — F331 Major depressive disorder, recurrent, moderate: Secondary | ICD-10-CM

## 2017-08-28 DIAGNOSIS — F329 Major depressive disorder, single episode, unspecified: Secondary | ICD-10-CM

## 2017-08-29 NOTE — Progress Notes (Signed)
Frederick AltesSean F Schultz   [id number]  04/09/1981  08/28/2017    Chief Complaint   Patient presents with   . Psycho Therapy        Time in:  1400              Time out: 1425    SUBJECTIVE:  Frederick AltesSean F Langer presents to clinic today per established outpatient therapy treatment plan. Frederick Schultz discussed his upcoming plans for Christmas and visiting his mother. Frederick Schultz discussed his frustration with the short story not being published and not hearing anything back about it. Frederick Schultz discussed the game he is involved with running and his frustration with some of the group members.     OBJECTIVE:     Orientation: Fully oriented to person, place, time and situation.  Marland Kitchen.     Appearance:  casually dressed.     Eye Contact:  good.     Behavior:  cooperative.     Attention:  good.     Speech:  normal rate and volume.     Motor:  no psychomotor retardation or agitation.     Mood:  anxious.     Affect:  appears anxious.     Thought Process:  goal oriented.     Thought Content:  no paranoia or delusions.     Suicidal Ideation:  none.     Homicidal Ideation:  none   Perception:  no hallucinations endorsed   Cognition:  concrete.     Insight:  fair.     Judgement:  fair.     Goals/wishes:  develop coping skills for anxiety.     Other objective findings:  none    ASSESSMENT:    MDD    PROCEDURE:  Used supportive dynamic therapy to encourage identification and expression of feeling states.  Reinforced patient's self-efficacy.  Encouraged increased understanding and acceptance of feelings.  Assisted patient in identifying unproductive defense mechanisms with more helpful adaptive coping strategies. Used Dialectical Behavior therapy to decrease emotion dysregulation, learn effective interpersonal relationship skills, and increase crisis survival skills.     Inquired for suicidal ideation/homicidal ideation. Frederick AltesSean F Magan does not endorse current SI or HI.  * Frederick AltesSean F Rando has information and plan for safety and is willing to seek help if SI and/or HI   are present.    PLAN:    Return for outpatient individual therapy bi weekly as per therapy treatment plan.     Latina Craveraitlin M Cashmere Dingley, LPC, 08/29/2017, 10:16

## 2017-09-11 ENCOUNTER — Ambulatory Visit (HOSPITAL_COMMUNITY): Payer: Self-pay | Admitting: Addiction Medicine

## 2017-09-11 MED ORDER — LITHIUM CARBONATE 300 MG CAPSULE
300.0000 mg | ORAL_CAPSULE | Freq: Every day | ORAL | 0 refills | Status: DC
Start: 2017-09-11 — End: 2017-12-03

## 2017-09-11 NOTE — Telephone Encounter (Addendum)
Refill provided to reach appointment in two days with Dr. Brendolyn Pattyhaudhary.     Frederick RankJeremy D Hustead, MD 09/11/2017, 11:03  Chief Resident, PGY-IV  Behavioral Medicine & Psychiatry  [address]  [place] [id number]  Phone: [phone] Fax: [phone]    ----- Message from Vivianne MasterLogan Carpenter sent at 09/11/2017  9:53 AM EST -----  Brendolyn Pattyhaudhary pt    Pharmacy is requesting a rx refill for the following Rx(s):    Current Outpatient Medications:  lithium carbonate (ESKALITH) 300 mg Oral Capsule (Discontinued)    Please call pharmacy to advise.

## 2017-09-13 ENCOUNTER — Ambulatory Visit (INDEPENDENT_AMBULATORY_CARE_PROVIDER_SITE_OTHER): Payer: Medicaid Other | Admitting: Psychiatry

## 2017-09-13 DIAGNOSIS — F419 Anxiety disorder, unspecified: Secondary | ICD-10-CM

## 2017-09-13 DIAGNOSIS — F32A Depression, unspecified: Secondary | ICD-10-CM

## 2017-09-13 DIAGNOSIS — F329 Major depressive disorder, single episode, unspecified: Secondary | ICD-10-CM

## 2017-09-13 MED ORDER — SERTRALINE 100 MG TABLET
200.0000 mg | ORAL_TABLET | Freq: Every day | ORAL | 1 refills | Status: DC
Start: 2017-09-13 — End: 2018-03-25

## 2017-09-13 MED ORDER — HYDROXYZINE PAMOATE 25 MG CAPSULE
ORAL_CAPSULE | ORAL | 1 refills | Status: DC
Start: 2017-09-13 — End: 2018-03-25

## 2017-09-13 MED ORDER — TRAZODONE 100 MG TABLET: Tab | ORAL | 0 refills | 0 days | Status: DC

## 2017-09-13 NOTE — Progress Notes (Signed)
Behavioral Medicine and Psychiatry   Outpatient Progress Note      Frederick AltesSean F Henegar   [id number]  07/05/1981  09/13/2017    DOS: 09/13/2017    CC: F/u for medication management    SUBJECTIVE:  Patient is a 37 y.o. male who presents to clinic as a follow up for depression. Patient has been stable on zoloft 200 mg and takes Vistaril prn for anxiety.     Patient reported that he is doing pretty good. His mood has ben stable on the current medications. He feels positive and hopeful. He denied any depressive symptoms including low mood, anhedonia, poor energy, hopelessness, helplessness or problems with appetite. Patient discussed his main problem today to be ongoing sleep problems. He has difficulty staying asleep and wakes up every 2-3 hrs. Has to take naps during the day too. Tried Remeron as prescribed on previous visit but it worsened his restless legs and he has not taken it since. Patient was counseled on good sleep hygiene. Discussed Trazodone and patient was agreeable to try this. Patient currently denies SI, HI or AVH.         Reported side effects: none     MEDICATIONS:    Outpatient Medications Prior to Visit:  acamprosate (CAMPRAL) 333 mg Oral Tablet, Delayed Release (E.C.) Take 2 Tabs (666 mg total) by mouth Three times a day (Patient not taking: Reported on 11/29/2016)   amLODIPine (NORVASC) 10 mg Oral Tablet Take 1 Tab (10 mg total) by mouth Once a day   hydroCHLOROthiazide (MICROZIDE) 12.5 mg Oral Capsule Take 1 Cap (12.5 mg total) by mouth Once a day   hydrOXYzine pamoate (VISTARIL) 25 mg Oral Capsule Take 1 pill twice a day as needed for anxiety. Take 2 pills at night as needed for anxiety and sleep.   lithium carbonate (ESKALITH) 300 mg Oral Capsule Take 1 Cap (300 mg total) by mouth Once a day   sertraline (ZOLOFT) 100 mg Oral Tablet Take 2 Tabs (200 mg total) by mouth Once a day for 30 days     No facility-administered medications prior to visit.     PHYSICAL:  General: no acute distress, patient sitting  comfortably in chair during interview  Neuro: normal gait, no abnormal movements  Skin: no rashes or lesions noted on exposed skin    OBJECTIVE:  Mental Status Exam:   Appearance:  casually dressed, appears actual age and no apparent distress   Behavior:  cooperative and eye contact  Good   Motor/MS:  normal gait   Speech:  normal   Mood:  euthymic   Affect:  stable   Perception:  normal   Thought:  goal directed/coherent   Thought Content:  appropriate    Suicidal Ideation:  none.     Homicidal Ideation:  none   Level of Consciousness:  alert   Orientation:  grossly normal   Concentration:  good   Memory:  grossly normal   Conceptual:  abstract thinking   Judgement:  good   Insight:  good       Social History:  No changes    ASSESSMENT:  Unspecified depressive disorder.  Unspecified anxiety disorder.    This is a 37 yo M with Unspecified depressive disorder and Unspecified anxiety disorder who is seen by this provider for his depression. Patient reports a history of Bipolar disorder and has been on Lithium 300 mg TID in the past. From detailed history taken from patient, he has never had a manic/hypomanic episode  and does not meet criteria for Bipolar disorder. He is currently stable on Zoloft and Lithium 300 mg HS which is currently being used as an adjunct to Zoloft for depression in this patient. Will discontinue lithium today as depression is under control. He takes Vistaril 25 mg p.r.n. for anxiety but this has not been helpful for sleep. Will start Trazodone today for sleep (side effects discussed in detail including priapism and patient verbalized understanding). No safety concerns during this visit.    PLAN:   1. Medications:   - Zoloft 200 mg daily - last increased 01/18/17    - Start Trazodone 100 mg HS for sleep (can take 1/2 tab if 100 mg is too sedating)  - Discontinue Lithium 300 mg qhs.                          - Vistaril 25 mg BID  prn for anxiety.  2. Labs: none  3. Therapy: With Levonne Hubert.  4. Safety Concerns: low  5. RTC in 2 months or sooner if needed. Patient advised to call with any questions or concerns.  6. Patient advised to report to nearest emergency department or to call 911 if having any suicidal or homicidal ideations.  7. Patient staffedwith Dr. Marylene Land.     Armida Sans, MD 09/13/2017, 11:00  [facility] [place]  [address]  [place] [address]  Dept: [phone]  Loc: [phone]       I saw and examined the patient.  I reviewed the resident's note.  I agree with the findings and plan of care as documented in the resident's note.  Any exceptions/additions are edited/noted.    Tyrone Schimke, MD

## 2017-10-09 ENCOUNTER — Encounter (HOSPITAL_COMMUNITY): Payer: Self-pay | Admitting: Professional

## 2017-11-28 ENCOUNTER — Telehealth (HOSPITAL_COMMUNITY): Payer: Self-pay | Admitting: FAMILY PRACTICE

## 2017-11-28 NOTE — Telephone Encounter (Signed)
Returned patient's call on behalf of his regular provider Mordecai RasmussenCaitlin Hannah, who is currently out on maternity leave.  I left him a message asking for a call back.    Estanislado SpireKathleen Chiasson-Downs

## 2017-12-02 ENCOUNTER — Telehealth (HOSPITAL_COMMUNITY): Payer: Self-pay | Admitting: Social Worker

## 2017-12-02 NOTE — Telephone Encounter (Signed)
Called patient to offer him an appointment with Irving BurtonEmily until Kingstonaitlin comes back. Patient did not answer the phone. Left a voice message with call back number.     Teddy Spikeolly Faatimah Spielberg, CASE MANAGER  12/02/2017, 11:31

## 2017-12-02 NOTE — Telephone Encounter (Signed)
-----   Message from Maralyn SagoKathleen M Chiasson-Downs, [address] [facility] sent at 11/29/2017  2:24 PM EDT -----  Regarding: app  Dennis BastHiya Wally Behan!  Can put this fella in Emily's schedule?  She has agreed to see him temporarily while Luther ParodyCaitlin is out of the office...    Thanks much to you both!!  Florentina AddisonKatie

## 2017-12-03 ENCOUNTER — Encounter (HOSPITAL_BASED_OUTPATIENT_CLINIC_OR_DEPARTMENT_OTHER): Payer: Self-pay | Admitting: Family Medicine

## 2017-12-03 ENCOUNTER — Ambulatory Visit: Payer: Medicaid Other | Attending: Family Medicine | Admitting: Family Medicine

## 2017-12-03 VITALS — BP 128/90 | HR 91 | Temp 98.2°F | Ht 65.98 in | Wt 224.6 lb

## 2017-12-03 DIAGNOSIS — J45909 Unspecified asthma, uncomplicated: Secondary | ICD-10-CM | POA: Insufficient documentation

## 2017-12-03 DIAGNOSIS — Z79899 Other long term (current) drug therapy: Secondary | ICD-10-CM | POA: Insufficient documentation

## 2017-12-03 DIAGNOSIS — F419 Anxiety disorder, unspecified: Secondary | ICD-10-CM | POA: Insufficient documentation

## 2017-12-03 DIAGNOSIS — F319 Bipolar disorder, unspecified: Secondary | ICD-10-CM | POA: Insufficient documentation

## 2017-12-03 DIAGNOSIS — I10 Essential (primary) hypertension: Secondary | ICD-10-CM | POA: Insufficient documentation

## 2017-12-03 MED ORDER — TRIAMTERENE 37.5 MG-HYDROCHLOROTHIAZIDE 25 MG TABLET
1.0000 | ORAL_TABLET | Freq: Every day | ORAL | 3 refills | Status: DC
Start: 2017-12-03 — End: 2018-04-09

## 2017-12-05 NOTE — Progress Notes (Signed)
Subjective:     Patient ID:  Frederick Schultz is an 37 y.o. male   Chief Complaint:    Chief Complaint   Patient presents with   . Follow Up 6 Months       HPInonsmoker with htn improving control - stress and anxiety and unable to work - seeing psychiatry    Past Medical History:   Diagnosis Date   . Asthma    . Bipolar 1 disorder (CMS HCC)      Past Surgical History:   Procedure Laterality Date   . ABDOMINAL HERNIA REPAIR             Review of Systemsno cp or sob or bleeding  Objective:   Physical Exam BP 128/90 (Site: Left, Patient Position: Sitting, Cuff Size: Adult Large)   Pulse 91   Temp 36.8 C (98.2 F) (Thermal Scan)   Ht 1.676 m (5' 5.98")   Wt 101.9 kg (224 lb 10.4 oz)   SpO2 98%   BMI 36.28 kg/m     nad  Clear to auscultation no wheezes, heart rrr no murmurs, good upper and lower extremity pulses, no carotid, femoral or abdominal bruits, no thyroid nodules    Some edema at ankles but non pitting and sl indentation of socks    Good historian  Ortho Exam    Assessment & Plan:     (I10) Hypertension, unspecified type  (primary encounter diagnosis)  Plan: BASIC METABOLIC PANEL- bp is still a little high and I increased his diuretic to maxide 25/37.5  Cont amlodipine          Current Outpatient Medications   Medication Sig   . acamprosate (CAMPRAL) 333 mg Oral Tablet, Delayed Release (E.C.) Take 2 Tabs (666 mg total) by mouth Three times a day (Patient not taking: Reported on 11/29/2016)   . amLODIPine (NORVASC) 10 mg Oral Tablet Take 1 Tab (10 mg total) by mouth Once a day   . hydrOXYzine pamoate (VISTARIL) 25 mg Oral Capsule Take 1 pill twice a day as needed for anxiety. Take 2 pills at night as needed for anxiety and sleep.   Marland Kitchen. sertraline (ZOLOFT) 100 mg Oral Tablet Take 2 Tabs (200 mg total) by mouth Once a day for 90 days   . traZODone (DESYREL) 100 mg Oral Tablet Start with half pill (50 mg) at bedtime for sleep. If not helpful, take full tablet at bedtime. (Patient not taking: Reported on 12/03/2017)     . triamterene-hydroCHLOROthiazide (MAXZIDE-25) 37.5-25 mg Oral Tablet Take 1 Tab by mouth Once a day     rtc 3 - 6 mo or prn depending on bp    Bernita BuffyGregory A Doyle, MD

## 2017-12-12 ENCOUNTER — Encounter (HOSPITAL_COMMUNITY): Payer: Self-pay | Admitting: Psychiatry

## 2017-12-17 ENCOUNTER — Other Ambulatory Visit (HOSPITAL_BASED_OUTPATIENT_CLINIC_OR_DEPARTMENT_OTHER): Payer: Self-pay | Admitting: Family Medicine

## 2017-12-17 DIAGNOSIS — I10 Essential (primary) hypertension: Secondary | ICD-10-CM

## 2018-03-05 ENCOUNTER — Encounter (HOSPITAL_BASED_OUTPATIENT_CLINIC_OR_DEPARTMENT_OTHER): Payer: Medicaid Other | Admitting: Family Medicine

## 2018-03-25 ENCOUNTER — Ambulatory Visit (INDEPENDENT_AMBULATORY_CARE_PROVIDER_SITE_OTHER): Payer: Medicaid Other | Admitting: Psychiatry

## 2018-03-25 DIAGNOSIS — F329 Major depressive disorder, single episode, unspecified: Secondary | ICD-10-CM

## 2018-03-25 DIAGNOSIS — F32A Depression, unspecified: Secondary | ICD-10-CM

## 2018-03-25 DIAGNOSIS — F419 Anxiety disorder, unspecified: Secondary | ICD-10-CM

## 2018-03-25 MED ORDER — BUSPIRONE 7.5 MG TABLET: 8 mg | Tab | Freq: Two times a day (BID) | ORAL | 2 refills | 0 days | Status: DC

## 2018-03-25 MED ORDER — SERTRALINE 100 MG TABLET
200.0000 mg | ORAL_TABLET | Freq: Every day | ORAL | 1 refills | Status: DC
Start: 2018-03-25 — End: 2018-07-15

## 2018-03-25 NOTE — Progress Notes (Signed)
Behavioral Medicine and Psychiatry   Outpatient Progress Note      Frederick Schultz   [id number]  06/28/1981  03/25/2018    DOS: 03/25/2018    CC: F/u for medication management    SUBJECTIVE:  Patient is a 37 y.o. male who presents to clinic as a follow up for depression.     Patient presents alone today.  Patient reported that his mood has worsened over the past month.  Reported that his main problem is anxiety and irritability.  He feels anxious in social situations as he feels that people are against him and are talking about him all the time.  Reported that he gets irritated easily as he feels that people think he is not smart and try to take advantage of him.  Patient also reported that he has trouble sleeping at night.  He denied any problems with his appetite.  He denied SI, HI or AVH.  Who reported that Zoloft has been helpful for his depression significantly.  Patient is agreeable to start BuSpar for better control of anxiety. Patient also stopped taking trazodone as he was afraid of side effects ( priapism) and does not want to restart this again today.      Reported side effects: none     MEDICATIONS:    Outpatient Medications Prior to Visit:  acamprosate (CAMPRAL) 333 mg Oral Tablet, Delayed Release (E.C.) Take 2 Tabs (666 mg total) by mouth Three times a day (Patient not taking: Reported on 11/29/2016)   amLODIPine (NORVASC) 10 mg Oral Tablet Take 1 Tab (10 mg total) by mouth Once a day   hydrOXYzine pamoate (VISTARIL) 25 mg Oral Capsule Take 1 pill twice a day as needed for anxiety. Take 2 pills at night as needed for anxiety and sleep.   sertraline (ZOLOFT) 100 mg Oral Tablet Take 2 Tabs (200 mg total) by mouth Once a day for 90 days   traZODone (DESYREL) 100 mg Oral Tablet Start with half pill (50 mg) at bedtime for sleep. If not helpful, take full tablet at bedtime. (Patient not taking: Reported on 12/03/2017)   triamterene-hydroCHLOROthiazide (MAXZIDE-25) 37.5-25 mg Oral Tablet Take 1 Tab by mouth Once a day        No facility-administered medications prior to visit.     PHYSICAL:  General: no acute distress, patient sitting comfortably in chair during interview  Neuro: normal gait, no abnormal movements  Skin: no rashes or lesions noted on exposed skin    OBJECTIVE:  Mental Status Exam:   Appearance:  casually dressed, appears actual age and no apparent distress   Behavior:  cooperative and eye contact  Good   Motor/MS:  normal gait   Speech:  normal   Mood:  anxious   Affect:  stable   Perception:  normal   Thought:  goal directed/coherent   Thought Content:  appropriate    Suicidal Ideation:  none.     Homicidal Ideation:  none   Level of Consciousness:  alert   Orientation:  grossly normal   Concentration:  good   Memory:  grossly normal   Conceptual:  abstract thinking   Judgement:  good   Insight:  good       Social History:  No changes    ASSESSMENT:  Unspecified depressive disorder.  Unspecified anxiety disorder.    This is a 37 yo M with Unspecified depressive disorder and Unspecified anxiety disorder who is seen by this provider for his depression. Patient reports a  history of Bipolar disorder and has been on Lithium 300 mg TID in the past. From detailed history taken from patient, he has never had a manic/hypomanic episode and does not meet criteria for Bipolar disorder. He is currently stable on Zoloft and Lithium 300 mg HS was discontinued at the previous appointment. He takes Vistaril 25 mg p.r.n. for anxiety but this has not been helpful for sleep.   Discontinue trazodone as he was afraid of side effect of priapism. No safety concerns during this visit.    PLAN:   1. Medications:   - Zoloft 200 mg daily - last increased 01/18/17    - Discontinue Trazodone 100 mg HS for sleep as patient is not taking this  - Start BuSpar 7.5 mg b.i.d.                          - Vistaril 25 mg BID prn for anxiety.  2. Labs: none  3. Therapy: With Levonne Hubertaitlin  Hanna.  4. Safety Concerns: low  5. RTC in 2 months or sooner if needed. Patient advised to call with any questions or concerns.  6. Patient advised to report to nearest emergency department or to call 911 if having any suicidal or homicidal ideations.  7. Patient staffedwith Dr. Hyacinth MeekerMiller.     Armida SansSadia Chaudhary, MD 03/25/2018, 14:04  [facility] [place]  [address]  [place] [id number]  Dept: [phone]  Loc: [phone]       Orders Placed This Encounter   . busPIRone (BUSPAR) 7.5 mg Oral Tablet   . sertraline (ZOLOFT) 100 mg Oral Tablet       I saw and examined the patient on 03/25/2018.  I reviewed the resident's note.  I agree with the findings and plan of care as documented in the resident's note.  Any exceptions/additions are edited/noted.    Doristine BosworthMark D Shawnelle Spoerl, MD

## 2018-03-26 ENCOUNTER — Ambulatory Visit (INDEPENDENT_AMBULATORY_CARE_PROVIDER_SITE_OTHER): Payer: Medicaid Other | Admitting: Professional

## 2018-03-26 DIAGNOSIS — F331 Major depressive disorder, recurrent, moderate: Secondary | ICD-10-CM

## 2018-03-26 DIAGNOSIS — F329 Major depressive disorder, single episode, unspecified: Secondary | ICD-10-CM

## 2018-03-27 NOTE — Progress Notes (Signed)
Frederick AltesSean F Schultz   [id number]  10/24/1980  03/26/2018    Chief Complaint   Patient presents with   . Psycho Therapy        Time in:  1500              Time out: 1557    SUBJECTIVE:  Frederick Schultz presents to clinic today per established outpatient therapy treatment plan. Frederick Schultz discussed the events that have occurred since our last session (In December). Frederick Schultz reports increased anger and mood swings since stopping his Lithium medication. 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     OBJECTIVE:     Orientation: Fully oriented to person, place, time and situation.  Frederick Schultz Kitchen.     Appearance:  casually dressed.     Eye Contact:  good.     Behavior:  cooperative.     Attention:  good.     Speech:  normal rate and volume.     Motor:  no psychomotor retardation or agitation.     Mood:  anxious.     Affect:  appears anxious.     Thought Process:  goal oriented.     Thought Content:  no paranoia or delusions.     Suicidal Ideation:  none.     Homicidal Ideation:  none   Perception:  no hallucinations endorsed   Cognition:  concrete.     Insight:  fair.     Judgement:  fair.     Goals/wishes:  develop coping skills for anxiety.     Other objective findings:  none    ASSESSMENT:    MDD    PROCEDURE:  Used supportive dynamic therapy to encourage identification and expression of feeling states.  Reinforced patient's self-efficacy.  Encouraged increased understanding and acceptance of feelings.  Assisted patient in identifying unproductive defense mechanisms with more helpful adaptive coping strategies. Used Dialectical Behavior therapy to decrease emotion dysregulation, learn effective interpersonal relationship skills, and increase crisis survival skills.     Inquired for suicidal ideation/homicidal ideation. Frederick Schultz does not endorse current SI or HI.  * Frederick Schultz has information and plan for safety and is willing to seek help if SI and/or HI  are  present.    PLAN:    Return for outpatient individual therapy bi weekly as per therapy treatment plan.     Frederick Schultz, LPC, 03/27/2018, 13:23

## 2018-04-09 ENCOUNTER — Encounter (HOSPITAL_BASED_OUTPATIENT_CLINIC_OR_DEPARTMENT_OTHER): Payer: Self-pay | Admitting: Family Medicine

## 2018-04-09 ENCOUNTER — Ambulatory Visit: Payer: Medicaid Other | Attending: Family Medicine | Admitting: Family Medicine

## 2018-04-09 VITALS — BP 124/92 | HR 95 | Temp 96.4°F | Ht 65.98 in | Wt 226.9 lb

## 2018-04-09 DIAGNOSIS — I10 Essential (primary) hypertension: Secondary | ICD-10-CM | POA: Insufficient documentation

## 2018-04-09 DIAGNOSIS — M542 Cervicalgia: Secondary | ICD-10-CM | POA: Insufficient documentation

## 2018-04-09 DIAGNOSIS — M25511 Pain in right shoulder: Secondary | ICD-10-CM | POA: Insufficient documentation

## 2018-04-09 DIAGNOSIS — Z87891 Personal history of nicotine dependence: Secondary | ICD-10-CM | POA: Insufficient documentation

## 2018-04-09 DIAGNOSIS — Z79899 Other long term (current) drug therapy: Secondary | ICD-10-CM | POA: Insufficient documentation

## 2018-04-09 MED ORDER — TRIAMTERENE 75 MG-HYDROCHLOROTHIAZIDE 50 MG TABLET
1.0000 | ORAL_TABLET | Freq: Every day | ORAL | 3 refills | Status: DC
Start: 2018-04-09 — End: 2019-04-21

## 2018-04-09 NOTE — Progress Notes (Signed)
Subjective:     Patient ID:  Frederick Schultz is an 37 y.o. male   Chief Complaint:    Chief Complaint   Patient presents with   . Blood Pressure F/U       HPInonsmoker with anxiety depression and htn  Has right shoulder right neck and lumbar pain chronic - manageable but would like to see phys rx  Bps at home are also high diastolic numbers  Has a moderate amt of salt in diet  No significant edema  Past Medical History:   Diagnosis Date   . Asthma    . Bipolar 1 disorder (CMS HCC)      Past Surgical History:   Procedure Laterality Date   . ABDOMINAL HERNIA REPAIR           Review of Systems musculoskel chest wall pain - stress - no blood in urine or stool or sob  Objective:   Physical Exam BP (!) 124/92 (Site: Left, Patient Position: Sitting, Cuff Size: Adult Large)   Pulse 95   Temp 35.8 C (96.4 F) (Thermal Scan)   Ht 1.676 m (5' 5.98")   Wt 102.9 kg (226 lb 13.7 oz)   SpO2 97%   BMI 36.63 kg/m     nad  Clear to auscultation no wheezes, heart rrr no murmurs, good upper and lower extremity pulses, no carotid, femoral or abdominal bruits, no thyroid nodules    Tender right rotator cuff area  Tender and mild spasm right paracervical and bilat lumbar musculature  Good historian    Ortho Exam    Assessment & Plan:       ICD-10-CM    1. Neck pain M54.2 Refer to Physical Therapy-EXTERNAL   2. Encounter for follow-up for hypertension I10 BASIC METABOLIC PANEL   3. Shoulder pain, right M25.511 Refer to Physical Therapy-EXTERNAL     Increase diuretic and recheck bmp  Phys rx for muscle spasm and rotator cuff    rtc 2-6 m dep on bp  Or prn    Bernita BuffyGregory A Karysa Heft, MD

## 2018-04-09 NOTE — Progress Notes (Addendum)
Department of Family Medicine   Clinic Progress Note    RAMELO Schultz  MRN: [id number]  DOB: 1981-06-15  Date of Service: 04/09/2018    CHIEF COMPLAINT  Chief Complaint   Patient presents with   . Blood Pressure F/U       SUBJECTIVE    Frederick Schultz is a 37 y.o. male who presents to clinic for for folow up after adjustment of his blood pressure meds. He estimates ambulatory BP average around 120/90 but the last time he checked in was about 2 weeks ago. He is tolerating his BP meds well and reports no exacerbations of his underlying asthma. He does describes intermittent sudden sharp pains mostly localized to area around right clavicle and right side of thorax. They have been occurring for years and he says he's felt similar symptoms in other locations so it is not out of the usual for him. He attributes it to being of MSK origin. He denies diaphoresis, ABD pain, nausea, and squeezing-type chest discomfort during these episodes.   He has persistent pain neck, back and right shoulder and we are sending him to phys rx.      Review of Systems:    General: No fevers, chills, weight changes.  HEENT: No headaches or dizziness. No changes in hearing or vision.  Cardiovascular: No chest pain, heart palpitations, peripheral edema, or claudication.  Respiratory: No cough, dyspnea, or wheezing.  Gastrointestinal: No abdominal pain, N/V/D, or hematochezia.  Genitourinary: No dysuria, polyuria, incontinence, or hematuria.  Endocrine: No temperature intolerance or polydipsia.  Musculoskeletal: +myalgia +arthralgia "grinding" in right shoulder- tight right side of neck and lumbar area  Neurological: No weakness or paraesthesias.  Psych: +depression +anxiety    Past Medical History:   Past Medical History:   Diagnosis Date   . Asthma    . Bipolar 1 disorder (CMS [facility])             Surgical History:   Past Surgical History:   Procedure Laterality Date   . ABDOMINAL HERNIA REPAIR              Family History:  Family Medical History:      Problem Relation (Age of Onset)    Healthy Mother, Father    Heart Attack Father    High Cholesterol Father    Hypertension (High Blood Pressure) Father                Social History:  Social History     Socioeconomic History   . Marital status: Single     Spouse name: Not on file   . Number of children: Not on file   . Years of education: Not on file   . Highest education level: Not on file   Occupational History   . Occupation: [employer]   Social Needs   . Financial resource strain: Not on file   . Food insecurity:     Worry: Not on file     Inability: Not on file   . Transportation needs:     Medical: Not on file     Non-medical: Not on file   Tobacco Use   . Smoking status: Former Games developer   . Smokeless tobacco: Never Used   Substance and Sexual Activity   . Alcohol use: Yes     Alcohol/week: 0.0 oz     Comment: rare   . Drug use: No   . Sexual activity: Not on file   Lifestyle   .  Physical activity:     Days per week: Not on file     Minutes per session: Not on file   . Stress: Not on file   Relationships   . Social connections:     Talks on phone: Not on file     Gets together: Not on file     Attends religious service: Not on file     Active member of club or organization: Not on file     Attends meetings of clubs or organizations: Not on file     Relationship status: Not on file   . Intimate partner violence:     Fear of current or ex partner: Not on file     Emotionally abused: Not on file     Physically abused: Not on file     Forced sexual activity: Not on file   Other Topics Concern   . Abuse/Domestic Violence Not Asked   . Caffeine Concern Not Asked   . Calcium intake adequate Not Asked   . Computer Use Not Asked   . Drives Not Asked   . Exercise Concern Not Asked   . Helmet Use Not Asked   . Seat Belt Not Asked   . Special Diet Not Asked   . Sunscreen used Not Asked   . Uses Cane Not Asked   . Uses walker Not Asked   . Uses wheelchair Not Asked   . Right hand dominant Not Asked   . Left hand dominant Not  Asked   . Ambidextrous Not Asked   . Shift Work Not Asked   . Unusual Sleep-Wake Schedule Not Asked   Social History Narrative   . Not on file        Medications:     Outpatient Medications Prior to Visit:  acamprosate (CAMPRAL) 333 mg Oral Tablet, Delayed Release (E.C.) Take 2 Tabs (666 mg total) by mouth Three times a day (Patient not taking: Reported on 11/29/2016)   amLODIPine (NORVASC) 10 mg Oral Tablet Take 1 Tab (10 mg total) by mouth Once a day   busPIRone (BUSPAR) 7.5 mg Oral Tablet Take 1 Tab (7.5 mg total) by mouth Twice daily for 30 days   sertraline (ZOLOFT) 100 mg Oral Tablet Take 2 Tabs (200 mg total) by mouth Once a day for 90 days   traZODone (DESYREL) 100 mg Oral Tablet Start with half pill (50 mg) at bedtime for sleep. If not helpful, take full tablet at bedtime. (Patient not taking: Reported on 12/03/2017)   triamterene-hydroCHLOROthiazide (MAXZIDE-25) 37.5-25 mg Oral Tablet Take 1 Tab by mouth Once a day     No facility-administered medications prior to visit.     Allergies:   Allergies   Allergen Reactions   . Asa Buff (Mag Carb-Al Glyc) [Aspirin, Buffered]          OBJECTIVE      Weight: 102.9 kg (226 lb 13.7 oz) Body mass index is 36.63 kg/m.  Vitals:    04/09/18 1407   BP: (!) 124/92   Pulse: 95   Temp: 35.8 C (96.4 F)   TempSrc: Thermal Scan   SpO2: 97%   Weight: 102.9 kg (226 lb 13.7 oz)   Height: 1.676 m (5' 5.98")   BMI: 36.71       General: Well appearing, no acute distress   HEENT: PERRLA. EOMs intact. Sclera white. Conjunctiva clear. Pharynx red/pink without exudate. Mucous membranes moist.  Cardiovascular: Heart RRR without murmurs, gallops, or rubs. No peripheral  edema or cyanosis.   Lungs: Clear to auscultation bilaterally without crackles, wheezes, or rhonchi.   Abdomen: Soft and non-distended. Normoactive bowel sounds in all 4 quadrants. Non-tender to palpation with no guarding or rebound. No organomegaly.  Neurologic: Alert and oriented x3. Achilles and patellar reflexes 2+  B/L.   MSK:  Muscle strength 5/5 in upper and lower extremities B/L. Muscle bellies non-tender to palpation.  Pysch: Mood congruent with affect.     ASSESSMENT/PLAN      1. Encounter for follow-up for hypertension    -not edematous but does eat a moderate amount of salty foods- Increase triamterene-HCTZ to 75/50 mg po qd and check BMP for electrolyte distrubances. Discussed with pt importance of regular ambulatory blood pressure monitoring. Follow up in 2 months or PRN.    2. Neck pain    - Refer to Physical Therapy-EXTERNAL; Future    3. Shoulder pain, right    - Refer to Physical Therapy-EXTERNAL; Future        Kieth BrightlyJustin Conte, MED STUDENT(MS3)  04/09/2018, 14:20      I have seen and examined the patient in concert with the medical student  as noted above.  The above note has been modified as necessary by me to reflect my own, personal collection/validation of the patient's HPI, patient history, physical examination, and assessment and plan.    Bernita BuffyGregory A Doyle, MD

## 2018-04-22 ENCOUNTER — Encounter (HOSPITAL_COMMUNITY): Payer: Self-pay | Admitting: Psychiatry

## 2018-04-29 ENCOUNTER — Ambulatory Visit (INDEPENDENT_AMBULATORY_CARE_PROVIDER_SITE_OTHER): Payer: Medicaid Other | Admitting: Professional

## 2018-04-29 DIAGNOSIS — F331 Major depressive disorder, recurrent, moderate: Secondary | ICD-10-CM

## 2018-04-29 DIAGNOSIS — F329 Major depressive disorder, single episode, unspecified: Secondary | ICD-10-CM

## 2018-04-30 NOTE — Progress Notes (Signed)
Frederick AltesSean F Grand   [id number]  02/27/1981  04/29/2018    Chief Complaint   Patient presents with   . Psycho Therapy        Time in:  1400              Time out: 1500    SUBJECTIVE:  Frederick AltesSean F Schultz presents to clinic today per established outpatient therapy treatment plan. Frederick Schultz discussed the events that have occurred since our last session including his two trips to conventions. Frederick Schultz discussed the purpose of his life and trying to determine a responsibility for living.     OBJECTIVE:     Orientation: Fully oriented to person, place, time and situation.  Marland Kitchen.     Appearance:  casually dressed.     Eye Contact:  good.     Behavior:  cooperative.     Attention:  good.     Speech:  normal rate and volume.     Motor:  no psychomotor retardation or agitation.     Mood:  anxious.     Affect:  appears anxious.     Thought Process:  goal oriented.     Thought Content:  no paranoia or delusions.     Suicidal Ideation:  none.     Homicidal Ideation:  none   Perception:  no hallucinations endorsed   Cognition:  concrete.     Insight:  fair.     Judgement:  fair.     Goals/wishes:  develop coping skills for anxiety.     Other objective findings:  none    ASSESSMENT:    MDD    PROCEDURE:  Used supportive dynamic therapy to encourage identification and expression of feeling states.  Reinforced patient's self-efficacy.  Encouraged increased understanding and acceptance of feelings.  Assisted patient in identifying unproductive defense mechanisms with more helpful adaptive coping strategies. Used Dialectical Behavior therapy to decrease emotion dysregulation, learn effective interpersonal relationship skills, and increase crisis survival skills.     Inquired for suicidal ideation/homicidal ideation. Frederick Schultz does not endorse current SI or HI.  * Frederick Schultz has information and plan for safety and is willing to seek help if SI and/or HI  are present.    PLAN:    Return for outpatient individual therapy bi weekly as per  therapy treatment plan.     Latina Craveraitlin M Josten [place], LPC, 04/30/2018, 09:19

## 2018-05-13 ENCOUNTER — Ambulatory Visit (INDEPENDENT_AMBULATORY_CARE_PROVIDER_SITE_OTHER): Payer: Medicaid Other | Admitting: Psychiatry

## 2018-05-13 DIAGNOSIS — F419 Anxiety disorder, unspecified: Secondary | ICD-10-CM

## 2018-05-13 DIAGNOSIS — F329 Major depressive disorder, single episode, unspecified: Secondary | ICD-10-CM

## 2018-05-13 MED ORDER — BUSPIRONE 15 MG TABLET
15.00 mg | ORAL_TABLET | Freq: Two times a day (BID) | ORAL | 1 refills | Status: DC
Start: 2018-05-13 — End: 2018-07-15

## 2018-05-13 NOTE — Progress Notes (Signed)
Behavioral Medicine and Psychiatry   Outpatient Progress Note      GAVIN TELFORD   [id number]  [birth year]  05/13/2018    DOS: 05/13/2018    CC: F/u for medication management    SUBJECTIVE:  Patient is a 37 y.o. male who presents to clinic as a follow up for depression and anxiety.    Patient reported that he is doing "Ok". Stated that he continues to get easily frustrated when things don't go according to his wishes. Stated that anxiety is the main concern at this time. Feels anxious when around too many people. Is not able to go to grocery stores when it is too crowded. Did not find Buspar helpful at this dose and denies side effects. Is agreeable to increase dose. Currently denies SI, HI or AVH. Sleep and appetite are stable.       Reported side effects: none     MEDICATIONS:    Outpatient Medications Prior to Visit:  acamprosate (CAMPRAL) 333 mg Oral Tablet, Delayed Release (E.C.) Take 2 Tabs (666 mg total) by mouth Three times a day (Patient not taking: Reported on 11/29/2016)   amLODIPine (NORVASC) 10 mg Oral Tablet Take 1 Tab (10 mg total) by mouth Once a day   busPIRone (BUSPAR) 7.5 mg Oral Tablet Take 1 Tab (7.5 mg total) by mouth Twice daily for 30 days   sertraline (ZOLOFT) 100 mg Oral Tablet Take 2 Tabs (200 mg total) by mouth Once a day for 90 days   traZODone (DESYREL) 100 mg Oral Tablet Start with half pill (50 mg) at bedtime for sleep. If not helpful, take full tablet at bedtime. (Patient not taking: Reported on 12/03/2017)   triamterene-hydroCHLOROthiazide (MAXZIDE) 75-50 mg Oral Tablet Take 1 Tab by mouth Once a day     No facility-administered medications prior to visit.     PHYSICAL:  General: no acute distress, patient sitting comfortably in chair during interview  Neuro: normal gait, no abnormal movements  Skin: no rashes or lesions noted on exposed skin    OBJECTIVE:  Mental Status Exam:   Appearance:  casually dressed, appears actual age and no apparent distress   Behavior:  cooperative and eye contact   Good   Motor/MS:  normal gait   Speech:  normal   Mood:  anxious   Affect:  stable   Perception:  normal   Thought:  goal directed/coherent   Thought Content:  appropriate    Suicidal Ideation:  none.     Homicidal Ideation:  none   Level of Consciousness:  alert   Orientation:  grossly normal   Concentration:  good   Memory:  grossly normal   Conceptual:  abstract thinking   Judgement:  good   Insight:  good       Social History:  No changes    ASSESSMENT:  Unspecified depressive disorder.  Unspecified anxiety disorder.    This is a 37 yo M with Unspecified depressive disorder and Unspecified anxiety disorder who is seen by this provider for his depression. Patient reports a history of Bipolar disorder and has been on Lithium 300 mg TID in the past. From detailed history taken from patient, he has never had a manic/hypomanic episode and does not meet criteria for Bipolar disorder. He is currently stable on Zoloft. Lithium 300 mg HS was being used as an adjunct to antidepressant and was discontinued at a few months ago. He takes Vistaril 25 mg p.r.n. for anxiety but this has not  been helpful for sleep. Patient denied side effects with addition of Buspar and is agreeable to increase dose for better control of anxiety symptoms.  No safety concerns during this visit.    PLAN:   1. Medications:   - Zoloft 200 mg daily     - Increase BuSpar to 15 mg b.i.d.                          - Vistaril 25 mg BID prn for anxiety.  2. Labs: none  3. Therapy: With Levonne Hubert.  4. Safety Concerns: low  5. RTC in 4-6 weeks or sooner if needed. Patient advised to call with any questions or concerns.  6. Patient advised to report to nearest emergency department or to call 911 if having any suicidal or homicidal ideations.  7. Patient staffedwith Dr. Alessandra Bevels.     Armida Sans, MD 05/13/2018, 10:07  [facility] [place]  [address]  [place]  [address]  Dept: [phone]  Loc: [phone]       Orders Placed This Encounter   . busPIRone (BUSPAR) 15 mg Oral Tablet       I saw and examined the patient.  I reviewed the resident's note.  I agree with the findings and plan of care as documented in the resident's note.  Any exceptions/additions are edited/noted.    Derek Mound, MD

## 2018-05-27 ENCOUNTER — Ambulatory Visit: Payer: Medicaid Other | Attending: Family Medicine | Admitting: Family Medicine

## 2018-05-27 ENCOUNTER — Ambulatory Visit (HOSPITAL_BASED_OUTPATIENT_CLINIC_OR_DEPARTMENT_OTHER): Payer: Medicaid Other

## 2018-05-27 ENCOUNTER — Encounter (HOSPITAL_BASED_OUTPATIENT_CLINIC_OR_DEPARTMENT_OTHER): Payer: Self-pay | Admitting: Family Medicine

## 2018-05-27 VITALS — BP 118/76 | HR 84 | Temp 97.2°F | Ht 65.98 in | Wt 219.6 lb

## 2018-05-27 DIAGNOSIS — I10 Essential (primary) hypertension: Secondary | ICD-10-CM

## 2018-05-27 DIAGNOSIS — Z5189 Encounter for other specified aftercare: Secondary | ICD-10-CM | POA: Insufficient documentation

## 2018-05-27 DIAGNOSIS — Z5181 Encounter for therapeutic drug level monitoring: Secondary | ICD-10-CM

## 2018-05-27 LAB — BASIC METABOLIC PANEL
ANION GAP: 7 mmol/L (ref 4–13)
BUN/CREA RATIO: 12 (ref 6–22)
BUN: 17 mg/dL (ref 8–25)
CALCIUM: 9.5 mg/dL (ref 8.5–10.2)
CALCIUM: 9.5 mg/dL (ref 8.5–10.2)
CHLORIDE: 103 mmol/L (ref 96–111)
CO2 TOTAL: 30 mmol/L (ref 22–32)
CREATININE: 1.41 mg/dL — ABNORMAL HIGH (ref 0.62–1.27)
CREATININE: 1.41 mg/dL — ABNORMAL HIGH (ref 0.62–1.27)
ESTIMATED GFR: 57 mL/min/1.73mˆ2 — ABNORMAL LOW (ref >59)
GLUCOSE: 101 mg/dL (ref 65–139)
POTASSIUM: 3.8 mmol/L (ref 3.5–5.1)
SODIUM: 140 mmol/L (ref 136–145)

## 2018-05-27 LAB — CBC
HCT: 44.6 % (ref 38.9–52.0)
HCT: 44.6 % (ref 38.9–52.0)
HGB: 16 g/dL (ref 13.4–17.5)
MCH: 29.8 pg (ref 26.0–32.0)
MCHC: 35.9 g/dL — ABNORMAL HIGH (ref 31.0–35.5)
MCV: 83.1 fL (ref 78.0–100.0)
MPV: 11.9 fL (ref 8.7–12.5)
PLATELETS: 234 x10ˆ3/uL (ref 150–400)
RBC: 5.37 10*6/uL (ref 4.50–6.10)
RDW-CV: 12.5 % (ref 11.5–15.5)
WBC: 8.3 x10ˆ3/uL (ref 3.7–11.0)

## 2018-05-27 LAB — LDL CHOLESTEROL, DIRECT: LDL DIRECT: 77 mg/dL (ref <=100)

## 2018-05-27 LAB — ALT (SGPT): ALT (SGPT): 30 U/L (ref <55)

## 2018-05-27 MED ORDER — AMLODIPINE 10 MG TABLET
10.0000 mg | ORAL_TABLET | Freq: Every day | ORAL | 3 refills | Status: DC
Start: 2018-05-27 — End: 2019-04-24

## 2018-05-30 NOTE — Progress Notes (Signed)
Subjective:     Patient ID:  Shelda AltesSean F Berzins is an 37 y.o. male   Chief Complaint:    Chief Complaint   Patient presents with   . Blood Pressure F/U       HPInonsmoker with htn for f/u - better  Bipolar stress stable  Not sob    Past Medical History:   Diagnosis Date   . Asthma    . Bipolar 1 disorder (CMS HCC)      Past Surgical History:   Procedure Laterality Date   . ABDOMINAL HERNIA REPAIR         Review of Systems no cp or sob or bleeding  Objective:   Physical Exam BP 118/76 (Site: Left, Patient Position: Sitting, Cuff Size: Adult Large)   Pulse 84   Temp 36.2 C (97.2 F) (Thermal Scan)   Ht 1.676 m (5' 5.98")   Wt 99.6 kg (219 lb 9.3 oz)   SpO2 98%   BMI 35.46 kg/m     nad  Supple abd  Good historian  Clear to auscultation no wheezes, heart rrr no murmurs, good upper and lower extremity pulses, no carotid, femoral or abdominal bruits, no thyroid nodules      Ortho Exam    Assessment & Plan:       ICD-10-CM    1. Hypertension, unspecified type I10 BASIC METABOLIC PANEL     ALT (SGPT)     CBC     LDL CHOLESTEROL, DIRECT   2. Therapeutic drug monitoring Z51.81 BASIC METABOLIC PANEL     ALT (SGPT)     CBC   3. HTN (hypertension) I10 amLODIPine (NORVASC) 10 mg Oral Tablet           Refill amlodipine 10 rtc 6 mo or prn    Bernita BuffyGregory A Doyle, MD

## 2018-06-09 ENCOUNTER — Other Ambulatory Visit (HOSPITAL_BASED_OUTPATIENT_CLINIC_OR_DEPARTMENT_OTHER): Payer: Self-pay | Admitting: Family Medicine

## 2018-06-09 DIAGNOSIS — R7989 Other specified abnormal findings of blood chemistry: Secondary | ICD-10-CM

## 2018-06-10 ENCOUNTER — Ambulatory Visit (INDEPENDENT_AMBULATORY_CARE_PROVIDER_SITE_OTHER): Payer: Medicaid Other | Admitting: Psychiatry

## 2018-06-10 DIAGNOSIS — F329 Major depressive disorder, single episode, unspecified: Secondary | ICD-10-CM

## 2018-06-10 DIAGNOSIS — F419 Anxiety disorder, unspecified: Secondary | ICD-10-CM

## 2018-06-10 NOTE — Progress Notes (Signed)
Behavioral Medicine and Psychiatry   Outpatient Progress Note      ZAKIAH BECKERMAN   [id number]  [birth year]  06/10/2018    DOS: 06/10/2018    CC: F/u for medication management    SUBJECTIVE:  Patient is a 37 y.o. male who presents to clinic as a follow up for depression and anxiety.    Patient reported that he feels "about the same". He has difficulty remembering to take the second dose of buspar and as a result has not been consistent. He reports anxiety is still a problem but understands that he needs to take medications consistently. He was advised and is agreeable to continue the same plan before any further changes are made. He reports feeling frustrated and angry at the world in general. Is working with his therapist. Denied any other concerns. Denied SI, HI or AVH. Sleep and appetite remain same.        Reported side effects: none     MEDICATIONS:    Outpatient Medications Prior to Visit:  amLODIPine (NORVASC) 10 mg Oral Tablet Take 1 Tab (10 mg total) by mouth Once a day   busPIRone (BUSPAR) 15 mg Oral Tablet Take 15 mg by mouth Twice daily for 30 days   sertraline (ZOLOFT) 100 mg Oral Tablet Take 2 Tabs (200 mg total) by mouth Once a day for 90 days   triamterene-hydroCHLOROthiazide (MAXZIDE) 75-50 mg Oral Tablet Take 1 Tab by mouth Once a day     No facility-administered medications prior to visit.     PHYSICAL:  General: no acute distress, patient sitting comfortably in chair during interview  Neuro: normal gait, no abnormal movements  Skin: no rashes or lesions noted on exposed skin    OBJECTIVE:  Mental Status Exam:   Appearance:  casually dressed, appears actual age and no apparent distress   Behavior:  cooperative and eye contact  Good   Motor/MS:  normal gait   Speech:  normal   Mood:  "same"   Affect:  stable   Perception:  normal   Thought:  goal directed/coherent   Thought Content:  appropriate    Suicidal Ideation:  none.     Homicidal Ideation:  none   Level of Consciousness:   alert   Orientation:  grossly normal   Concentration:  good   Memory:  grossly normal   Conceptual:  abstract thinking   Judgement:  good   Insight:  good       Social History:  No changes    ASSESSMENT:  Unspecified depressive disorder.  Unspecified anxiety disorder.    This is a 37 yo M with Unspecified depressive disorder and Unspecified anxiety disorder who is seen by this provider for his depression. Patient reports a history of Bipolar disorder and has been on Lithium 300 mg TID in the past. From detailed history taken from patient, he has never had a manic/hypomanic episode and does not meet criteria for Bipolar disorder. He is currently stable on Zoloft.  He takes Vistaril 25 mg p.r.n. for anxiety. buspar was added since anxiety continues to be a problem but patient is not taking it consistently.  Patient denied side effects with addition of Buspar and is agreeable to be more consistent for better control of anxiety symptoms.  No safety concerns during this visit.    PLAN:   1. Medications:   - Zoloft 200 mg daily     - BuSpar 15 mg b.i.d.                          -  Vistaril 25 mg BID prn for anxiety.  2. Labs: none  3. Therapy: With Levonne Hubert.  4. Safety Concerns: low  5. RTC in 4-6 weeks or sooner if needed. Patient advised to call with any questions or concerns.  6. Patient advised to report to nearest emergency department or to call 911 if having any suicidal or homicidal ideations.  7. Patient staffedwith Dr. Alessandra Bevels.     Armida Sans, MD 06/10/2018, 15:12  [facility]  [address]  [place] [address]  Dept: [phone]  Loc: [phone]       No orders of the defined types were placed in this encounter.      I saw and examined the patient.  I reviewed the resident's note.  I agree with the findings and plan of care as documented in the resident's note.  Any exceptions/additions are edited/noted.    Derek Mound,  MD

## 2018-06-25 ENCOUNTER — Ambulatory Visit (INDEPENDENT_AMBULATORY_CARE_PROVIDER_SITE_OTHER): Payer: Medicaid Other | Admitting: Professional

## 2018-06-25 DIAGNOSIS — F331 Major depressive disorder, recurrent, moderate: Secondary | ICD-10-CM

## 2018-06-25 DIAGNOSIS — F329 Major depressive disorder, single episode, unspecified: Secondary | ICD-10-CM

## 2018-06-26 NOTE — Progress Notes (Signed)
Frederick Schultz   [id number]  06-03-1981  06/25/2018    Chief Complaint   Patient presents with   . Psycho Therapy        Time in:  1415              Time out: 1500    SUBJECTIVE:  Frederick Schultz presents to clinic today per established outpatient therapy treatment plan. Frederick Schultz discussed the events that have occurred since our last session including his overwhelming feels of loneliness. Frederick Schultz discussed the history of his past relationships and how they ended. We discussed his willingness to reach out to engage others in relationships.     OBJECTIVE:     Orientation: Fully oriented to person, place, time and situation.  Marland Kitchen     Appearance:  casually dressed.     Eye Contact:  good.     Behavior:  cooperative.     Attention:  good.     Speech:  normal rate and volume.     Motor:  no psychomotor retardation or agitation.     Mood:  anxious.     Affect:  appears anxious.     Thought Process:  goal oriented.     Thought Content:  no paranoia or delusions.     Suicidal Ideation:  none.     Homicidal Ideation:  none   Perception:  no hallucinations endorsed   Cognition:  concrete.     Insight:  fair.     Judgement:  fair.     Goals/wishes:  develop coping skills for anxiety.     Other objective findings:  none    ASSESSMENT:    MDD    PROCEDURE:  Used supportive dynamic therapy to encourage identification and expression of feeling states.  Reinforced patient's self-efficacy.  Encouraged increased understanding and acceptance of feelings.  Assisted patient in identifying unproductive defense mechanisms with more helpful adaptive coping strategies. Used Dialectical Behavior therapy to decrease emotion dysregulation, learn effective interpersonal relationship skills, and increase crisis survival skills.     Inquired for suicidal ideation/homicidal ideation. Frederick Schultz does not endorse current SI or HI.  * Frederick Schultz has information and plan for safety and is willing to seek help if SI and/or HI  are present.    PLAN:     Return for outpatient individual therapy bi weekly as per therapy treatment plan.     Latina Craver, LPC, 06/26/2018, 14:25

## 2018-07-15 ENCOUNTER — Ambulatory Visit (INDEPENDENT_AMBULATORY_CARE_PROVIDER_SITE_OTHER): Payer: Medicaid Other | Admitting: Psychiatry

## 2018-07-15 DIAGNOSIS — F329 Major depressive disorder, single episode, unspecified: Principal | ICD-10-CM

## 2018-07-15 DIAGNOSIS — F32A Depression, unspecified: Secondary | ICD-10-CM

## 2018-07-15 DIAGNOSIS — F419 Anxiety disorder, unspecified: Secondary | ICD-10-CM

## 2018-07-15 MED ORDER — SERTRALINE 100 MG TABLET
200.0000 mg | ORAL_TABLET | Freq: Every day | ORAL | 1 refills | Status: DC
Start: 2018-07-15 — End: 2018-11-11

## 2018-07-15 MED ORDER — BUSPIRONE 10 MG TABLET
20.00 mg | ORAL_TABLET | Freq: Two times a day (BID) | ORAL | 1 refills | Status: DC
Start: 2018-07-15 — End: 2018-08-14

## 2018-07-15 NOTE — Progress Notes (Signed)
Behavioral Medicine and Psychiatry   Outpatient Progress Note      Frederick Schultz   [id number]  [birth year]  07/15/2018    DOS: 07/15/2018    CC: F/u for medication management    SUBJECTIVE:  Patient is a 37 y.o. male who presents to clinic as a follow up for depression and anxiety.    Patient presents alone today and reported he feels "Ok". Stated his anxiety continues to be bad and he has not seen much benefit with buspar so far. Also mentioned that he continues to struggle with feeling on anger and resentment at the world in general since other people "have it better than me". Reports feeling lonely. Talked about his family who he is close to and they are very supportive. Is planning to visit his father in a few days in [place]. Denied any changes in sleep or appetite. Denied SI, HI or AVH. No other concerns. Agreeable to increase buspar to 29 mg BID.      Reported side effects: none     MEDICATIONS:    Outpatient Medications Prior to Visit:  amLODIPine (NORVASC) 10 mg Oral Tablet Take 1 Tab (10 mg total) by mouth Once a day   busPIRone (BUSPAR) 15 mg Oral Tablet Take 15 mg by mouth Twice daily for 30 days   sertraline (ZOLOFT) 100 mg Oral Tablet Take 2 Tabs (200 mg total) by mouth Once a day for 90 days   triamterene-hydroCHLOROthiazide (MAXZIDE) 75-50 mg Oral Tablet Take 1 Tab by mouth Once a day     No facility-administered medications prior to visit.     PHYSICAL:  General: no acute distress, patient sitting comfortably in chair during interview  Neuro: normal gait, no abnormal movements  Skin: no rashes or lesions noted on exposed skin    OBJECTIVE:  Mental Status Exam:   Appearance:  casually dressed, appears actual age and no apparent distress   Behavior:  cooperative and eye contact  Good   Motor/MS:  normal gait   Speech:  normal   Mood:  "Ok"   Affect:  stable   Perception:  normal   Thought:  goal directed/coherent   Thought Content:  appropriate    Suicidal Ideation:  none.     Homicidal Ideation:   none   Level of Consciousness:  alert   Orientation:  grossly normal   Concentration:  good   Memory:  grossly normal   Conceptual:  abstract thinking   Judgement:  good   Insight:  good       Social History:  No changes    ASSESSMENT:  Unspecified depressive disorder.  Unspecified anxiety disorder.    This is a 37 yo M with Unspecified depressive disorder and Unspecified anxiety disorder who is seen by this provider for these. Patient is currently stable on Zoloft.  He takes Vistaril 25 mg p.r.n. for anxiety. Buspar was added recently since anxiety continues to be a problem and will be increased today for better control of anxiety.  Patient denied side effects with addition of Buspar.  No safety concerns during this visit.    PLAN:   1. Medications:   - Zoloft 200 mg daily     - Increase BuSpar to 20 mg b.i.d. - Benefits and risks were discussed - patient verbalized understanding                           - Vistaril 25 mg BID  prn for anxiety.  2. Labs: none  3. Therapy: With Levonne Hubert.  4. Safety Concerns: low  5. RTC in 4-6 weeks or sooner if needed. Patient advised to call with any questions or concerns.  6. Patient advised to report to nearest emergency department or to call 911 if having any suicidal or homicidal ideations.  7. Patient staffedwith Dr. Allyn Kenner.     Armida Sans, MD 07/15/2018, 14:40  [facility] [place]  [address]  [place] [address]  Dept: [phone]  Loc: [phone]       Orders Placed This Encounter   . busPIRone (BUSPAR) 10 mg Oral Tablet   . sertraline (ZOLOFT) 100 mg Oral Tablet       I saw and examined the patient.  I reviewed the resident's note.  I agree with the findings and plan of care as documented in the resident's note.  Any exceptions/additions are edited/noted.    Ardeen Jourdain, MD

## 2018-07-30 ENCOUNTER — Ambulatory Visit (INDEPENDENT_AMBULATORY_CARE_PROVIDER_SITE_OTHER): Payer: Medicaid Other | Admitting: Professional

## 2018-07-30 DIAGNOSIS — F329 Major depressive disorder, single episode, unspecified: Secondary | ICD-10-CM

## 2018-07-30 DIAGNOSIS — F331 Major depressive disorder, recurrent, moderate: Secondary | ICD-10-CM

## 2018-07-31 NOTE — Progress Notes (Signed)
Frederick AltesSean F Schultz   [id number]  03/24/1981  07/30/2018    Chief Complaint   Patient presents with   . Psycho Therapy        Time in:  1400              Time out: 1446    SUBJECTIVE:  Frederick Schultz presents to clinic today per established outpatient therapy treatment plan. Frederick Schultz discussed the events that have occurred since our last session including his short story getting published. Frederick Schultz also discussed his dog's health and recent surgery. Frederick Schultz discussed his upcoming holiday and how they will celebrate with his mother.     OBJECTIVE:     Orientation: Fully oriented to person, place, time and situation.  Marland Kitchen.     Appearance:  casually dressed.     Eye Contact:  good.     Behavior:  cooperative.     Attention:  good.     Speech:  normal rate and volume.     Motor:  no psychomotor retardation or agitation.     Mood:  anxious.     Affect:  appears anxious.     Thought Process:  goal oriented.     Thought Content:  no paranoia or delusions.     Suicidal Ideation:  none.     Homicidal Ideation:  none   Perception:  no hallucinations endorsed   Cognition:  concrete.     Insight:  fair.     Judgement:  fair.     Goals/wishes:  develop coping skills for anxiety.     Other objective findings:  none    ASSESSMENT:    MDD    PROCEDURE:  Used supportive dynamic therapy to encourage identification and expression of feeling states.  Reinforced patient's self-efficacy.  Encouraged increased understanding and acceptance of feelings.  Assisted patient in identifying unproductive defense mechanisms with more helpful adaptive coping strategies. Used Dialectical Behavior therapy to decrease emotion dysregulation, learn effective interpersonal relationship skills, and increase crisis survival skills.     Inquired for suicidal ideation/homicidal ideation. Frederick Schultz does not endorse current SI or HI.  * Frederick Schultz has information and plan for safety and is willing to seek help if SI and/or HI  are present.    PLAN:    Return for  outpatient individual therapy bi weekly as per therapy treatment plan.     [name], LPC, 07/31/2018, 15:10

## 2018-08-12 ENCOUNTER — Ambulatory Visit: Payer: Medicaid Other | Attending: Family Medicine | Admitting: Family Medicine

## 2018-08-12 VITALS — BP 118/86 | HR 83 | Temp 96.3°F | Ht 65.98 in | Wt 224.2 lb

## 2018-08-12 DIAGNOSIS — Z6836 Body mass index (BMI) 36.0-36.9, adult: Secondary | ICD-10-CM

## 2018-08-12 DIAGNOSIS — L301 Dyshidrosis [pompholyx]: Secondary | ICD-10-CM | POA: Insufficient documentation

## 2018-08-12 DIAGNOSIS — Z79899 Other long term (current) drug therapy: Secondary | ICD-10-CM | POA: Insufficient documentation

## 2018-08-12 DIAGNOSIS — Z23 Encounter for immunization: Secondary | ICD-10-CM

## 2018-08-12 DIAGNOSIS — R7989 Other specified abnormal findings of blood chemistry: Secondary | ICD-10-CM | POA: Insufficient documentation

## 2018-08-12 DIAGNOSIS — I10 Essential (primary) hypertension: Secondary | ICD-10-CM

## 2018-08-12 NOTE — Nursing Note (Signed)
1. Are you 37 years of age or older? yes  2. Have you ever had a severe reaction to a flu shot? no  3. Are you allergic to eggs? no  4. Are you allergic to latex? no  5. Are you allergic to Thimerosol? no  6. Are you experiencing acute illness symptoms or have you been running a fever? no  7. Do you have a medical condition or taking medications that suppress your immune system? no  8. Have you ever had Guillain-Barre syndrome or other neurologic disorder? no  9. Are you pregnant or breastfeeding? no    Immunization administered     Name Date Dose VIS Date Route    INFLUENZA VACCINE IM  Incomplete 0.5 mL 04/24/2018 Intramuscular          Melinda D Hoffa, MA 08/12/2018, 14:41

## 2018-08-12 NOTE — Patient Instructions (Signed)
2019-2020 Vaccine Information Statement    Vaccine Information Statement    Influenza (Flu) Vaccine (Inactivated or Recombinant): What You Need to Know    Many Vaccine Information Statements are available in Spanish and other languages. See www.immunize.org/vis  Hojas de informacin sobre vacunas estn disponibles en espaol y en muchos otros idiomas. Visite www.immunize.org/vis    1. Why get vaccinated?    Influenza vaccine can prevent influenza (flu).    Flu is a contagious disease that spreads around the [place] every year, usually between October and May. Anyone can get the flu, but it is more dangerous for some people. Infants and young children, people 65 years of age and older, pregnant women, and people with certain health conditions or a weakened immune system are at greatest risk of flu complications.    Pneumonia, bronchitis, sinus infections and ear infections are examples of flu-related complications. If you have a medical condition, such as heart disease, cancer or diabetes, flu can make it worse.    Flu can cause fever and chills, sore throat, muscle aches, fatigue, cough, headache, and runny or stuffy nose. Some people may have vomiting and diarrhea, though this is more common in children than adults.     Each year thousands of people in the [place] die from flu, and many more are hospitalized. Flu vaccine prevents millions of illnesses and flu-related visits to the doctor each year.    2. Influenza vaccines     CDC recommends everyone 6 months of age and older get vaccinated every flu season. Children 6 months through 8 years of age may need 2 doses during a single flu season.  Everyone else needs only 1 dose each flu season.    It takes about 2 weeks for protection to develop after vaccination.    There are many flu viruses, and they are always changing. Each year a new flu vaccine is made to protect against three or four viruses that are likely to cause disease in the upcoming flu  season. Even when the vaccine doesn't exactly match these viruses, it may still provide some protection.     Influenza vaccine does not cause flu.    Influenza vaccine may be given at the same time as other vaccines.    3. Talk with your health care provider    Tell your vaccine provider if the person getting the vaccine:  . Has had an allergic reaction after a previous dose of influenza vaccine, or has any severe, life-threatening allergies.   . Has ever had Guillain-Barr Syndrome (also called GBS).    In some cases, your health care provider may decide to postpone influenza vaccination to a future visit.    People with minor illnesses, such as a cold, may be vaccinated. People who are moderately or severely ill should usually wait until they recover before getting influenza vaccine.    Your health care provider can give you more information.    4. Risks of a reaction    . Soreness, redness, and swelling where shot is given, fever, muscle aches, and headache can happen after influenza vaccine.  . There may be a very small increased risk of Guillain-Barr Syndrome (GBS) after inactivated influenza vaccine (the flu shot).    Young children who get the flu shot along with pneumococcal vaccine (PCV13), and/or DTaP vaccine at the same time might be slightly more likely to have a seizure caused by fever. Tell your health care provider if a child who is   getting flu vaccine has ever had a seizure.    People sometimes faint after medical procedures, including vaccination. Tell your provider if you feel dizzy or have vision changes or ringing in the ears.    As with any medicine, there is a very remote chance of a vaccine causing a severe allergic reaction, other serious injury, or death.    5. What if there is a serious problem?    An allergic reaction could occur after the vaccinated person leaves the clinic. If you see signs of a severe allergic reaction (hives, swelling of the face and throat, difficulty breathing, a  fast heartbeat, dizziness, or weakness), call 9-1-1 and get the person to the nearest hospital.    For other signs that concern you, call your health care provider.    Adverse reactions should be reported to the Vaccine Adverse Event Reporting System (VAERS). Your health care provider will usually file this report, or you can do it yourself. Visit the VAERS website at www.vaers.hhs.gov or call 1-800-822-7967.  VAERS is only for reporting reactions, and VAERS staff do not give medical advice.    6. The National Vaccine Injury Compensation Program    The National Vaccine Injury Compensation Program (VICP) is a federal program that was created to compensate people who may have been injured by certain vaccines. Visit the VICP website at www.hrsa.gov/vaccinecompensation or call 1-800-338-2382 to learn about the program and about filing a claim. There is a time limit to file a claim for compensation.    7. How can I learn more?    . Ask your health care provider.   . Call your local or state health department.  . Contact the Centers for Disease Control and Prevention (CDC):  - Call 1-800-232-4636 (1-800-CDC-INFO) or  - Visit CDC's influenza website at www.cdc.gov/flu    Vaccine Information Statement (Interim)  Inactivated Influenza Vaccine   04/24/2018  42 U.S.C.  300aa-26   Department of Health and Human Services  Centers for Disease Control and Prevention    Office Use Only

## 2018-08-12 NOTE — Progress Notes (Signed)
Subjective:     Patient ID:  Frederick Schultz is an 37 y.o. male   Chief Complaint:    Chief Complaint   Patient presents with   . Hypertension       HPI nontobacco user with htn with sl elev creat and high normal diastolics- he is interested in seeing nephrology  Dry mouth but may be due to psychotropic meds  Needs a flu shot  Dry skin of feet typical of dyshidrotic eczema  Otherwise fine  Discussed approval of hpv vaccine in 27-45 age group  Past Medical History:   Diagnosis Date   . Asthma    . Bipolar 1 disorder (CMS HCC)      Past Surgical History:   Procedure Laterality Date   . ABDOMINAL HERNIA REPAIR             Review of Systems discussed sl weight gain , diet - might be sub optimal  No cp or sob or blood in urine or stool  Objective:   Physical Exam BP 118/86   Pulse 83   Temp 35.7 C (96.3 F) (Thermal Scan)   Ht 1.676 m (5' 5.98")   Wt 101.7 kg (224 lb 3.3 oz)   SpO2 98%   BMI 36.21 kg/m     nad  Dry skin of feet slight raised and cracked by heels  Better affect - good insight and memory- good historian  Some indentation of skin by socks but subtle no significant edema  Ortho Exam    Assessment & Plan:       ICD-10-CM    1. High creatinine R79.89 BASIC METABOLIC PANEL     [facility]   2. 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

## 2018-08-14 ENCOUNTER — Ambulatory Visit (INDEPENDENT_AMBULATORY_CARE_PROVIDER_SITE_OTHER): Payer: Medicaid Other | Admitting: Psychiatry

## 2018-08-14 DIAGNOSIS — F32A Depression, unspecified: Secondary | ICD-10-CM

## 2018-08-14 DIAGNOSIS — F329 Major depressive disorder, single episode, unspecified: Principal | ICD-10-CM

## 2018-08-14 DIAGNOSIS — F419 Anxiety disorder, unspecified: Secondary | ICD-10-CM

## 2018-08-14 MED ORDER — BUSPIRONE 30 MG TABLET
30.00 mg | ORAL_TABLET | Freq: Two times a day (BID) | ORAL | 3 refills | Status: DC
Start: 2018-08-14 — End: 2018-11-11

## 2018-08-14 NOTE — Progress Notes (Signed)
Behavioral Medicine and Psychiatry   Outpatient Progress Note      Frederick AltesSean F Madrazo   [id number]  04/10/1981  08/14/2018    DOS: 08/14/2018    CC: F/u for medication management    SUBJECTIVE:  Patient is a 37 y.o. male who presents to clinic as a follow up for depression and anxiety.    Patient presented alone today and reported that he feels "the same".  Reported that his depression is not too bad but anxiety continues to be a problem.  Patient reported feeling frustrated at the world and resents people who have a better life than him.  Reported that sleep is a problem but on further questioning reported that he takes naps during the daytime.  Advised to maintain a good sleep hygiene.  Patient is agreeable to increase the dose of BuSpar to further help with his anxiety.  He currently denies SI, HI or AVH.    Reported side effects: none     MEDICATIONS:  amLODIPine (NORVASC) 10 mg Oral Tablet, Take 1 Tab (10 mg total) by mouth Once a day  busPIRone (BUSPAR) 10 mg Oral Tablet, Take 2 Tabs (20 mg total) by mouth Twice daily for 30 days  sertraline (ZOLOFT) 100 mg Oral Tablet, Take 2 Tabs (200 mg total) by mouth Once a day for 90 days  triamterene-hydroCHLOROthiazide (MAXZIDE) 75-50 mg Oral Tablet, Take 1 Tab by mouth Once a day    No facility-administered medications prior to visit.       PHYSICAL:  General: no acute distress, patient sitting comfortably in chair during interview  Neuro: normal gait, no abnormal movements  Skin: no rashes or lesions noted on exposed skin    OBJECTIVE:  Mental Status Exam:   Appearance:  casually dressed, appears actual age and no apparent distress   Behavior:  cooperative and eye contact  Good   Motor/MS:  normal gait   Speech:  normal   Mood:  "Ok"   Affect:  stable   Perception:  normal   Thought:  goal directed/coherent   Thought Content:  appropriate    Suicidal Ideation:  none.     Homicidal Ideation:  none   Level of Consciousness:  alert   Orientation:  grossly  normal   Concentration:  good   Memory:  grossly normal   Conceptual:  abstract thinking   Judgement:  good   Insight:  good       Social History:  No changes    ASSESSMENT:  Unspecified depressive disorder.  Unspecified anxiety disorder.    This is a 37 yo M with Unspecified depressive disorder and Unspecified anxiety disorder who is seen by this provider for these. Patient is currently stable on Zoloft.  He takes Vistaril 25 mg p.r.n. for anxiety. Buspar was added recently since anxiety continues to be a problem and will be increased today for better control of anxiety.  Patient denied side effects with addition of Buspar.  No safety concerns during this visit.    PLAN:   1. Medications:   - Zoloft 200 mg daily     - Increase BuSpar to 30 mg b.i.d. - Benefits and risks were discussed - patient verbalized understanding                           - Vistaril 25 mg BID prn for anxiety.  2. Labs: none  3. Therapy: With Levonne Hubertaitlin Hanna.  4. Safety Concerns: low  5. RTC in 4-6 weeks or sooner if needed. Patient advised to call with any questions or concerns.  6. Patient advised to report to nearest emergency department or to call 911 if having any suicidal or homicidal ideations.  7. Patient staffedwith Dr. Sherrilee Gilles.     Armida Sans, MD 08/14/2018, 14:34  [facility] [place]  [address]  [place] [address]  Dept: [phone]  Loc: [phone]       Orders Placed This Encounter   . busPIRone (BUSPAR) 30 mg Oral Tablet       I saw and examined the patient on DOS.  I reviewed the resident's note.  I agree with the findings and plan of care as documented in the resident's note.  Any exceptions/additions are edited/noted.    Kingsley Spittle, MD

## 2018-09-17 ENCOUNTER — Encounter (HOSPITAL_COMMUNITY): Payer: Medicaid Other | Admitting: Professional

## 2018-09-19 ENCOUNTER — Ambulatory Visit (INDEPENDENT_AMBULATORY_CARE_PROVIDER_SITE_OTHER): Payer: Medicaid Other | Admitting: Professional

## 2018-09-19 DIAGNOSIS — F329 Major depressive disorder, single episode, unspecified: Secondary | ICD-10-CM

## 2018-09-19 DIAGNOSIS — F331 Major depressive disorder, recurrent, moderate: Secondary | ICD-10-CM

## 2018-09-23 NOTE — Progress Notes (Signed)
Frederick Schultz   [id number]  [birth year]  09/19/2018    Chief Complaint   Patient presents with   . Psycho Therapy        Time in:  1200              Time out: 1250    SUBJECTIVE:  Frederick Schultz presents to clinic today per established outpatient therapy treatment plan. Imere discussed the events that have occurred since our last session including his discord with his roommate. We discussed the progress on his disability claim and how his Depression causes him significant distress.     OBJECTIVE:     Orientation: Fully oriented to person, place, time and situation.  Marland Kitchen     Appearance:  casually dressed.     Eye Contact:  good.     Behavior:  cooperative.     Attention:  good.     Speech:  normal rate and volume.     Motor:  no psychomotor retardation or agitation.     Mood:  anxious.     Affect:  appears anxious.     Thought Process:  goal oriented.     Thought Content:  no paranoia or delusions.     Suicidal Ideation:  none.     Homicidal Ideation:  none   Perception:  no hallucinations endorsed   Cognition:  concrete.     Insight:  fair.     Judgement:  fair.     Goals/wishes:  develop coping skills for anxiety.     Other objective findings:  none    ASSESSMENT:    MDD    PROCEDURE:  Used supportive dynamic therapy to encourage identification and expression of feeling states.  Reinforced patient's self-efficacy.  Encouraged increased understanding and acceptance of feelings.  Assisted patient in identifying unproductive defense mechanisms with more helpful adaptive coping strategies. Used Dialectical Behavior therapy to decrease emotion dysregulation, learn effective interpersonal relationship skills, and increase crisis survival skills.     Inquired for suicidal ideation/homicidal ideation. Frederick Schultz does not endorse current SI or HI.  * Frederick Schultz has information and plan for safety and is willing to seek help if SI and/or HI  are present.    PLAN:    Return for outpatient individual therapy bi  weekly as per therapy treatment plan.     Latina Craver, LPC, 09/23/2018, 09:25

## 2018-10-21 ENCOUNTER — Other Ambulatory Visit: Payer: Self-pay

## 2018-10-21 ENCOUNTER — Ambulatory Visit (INDEPENDENT_AMBULATORY_CARE_PROVIDER_SITE_OTHER): Payer: Medicaid Other | Admitting: Professional

## 2018-10-21 DIAGNOSIS — F329 Major depressive disorder, single episode, unspecified: Secondary | ICD-10-CM

## 2018-10-21 DIAGNOSIS — F331 Major depressive disorder, recurrent, moderate: Secondary | ICD-10-CM

## 2018-10-22 NOTE — Progress Notes (Signed)
RYUSEI MAYS   [id number]  07/25/1981  10/21/2018    Chief Complaint   Patient presents with   . Psycho Therapy        Time in:  1400              Time out: 1458    SUBJECTIVE:  KEYMONI KAMRADT presents to clinic today per established outpatient therapy treatment plan. Justinn discussed the events that have occurred since our last session including his increase in depressive symptoms. Kaicen processed the mean of suffering and how to practice opposite action.     OBJECTIVE:     Orientation: Fully oriented to person, place, time and situation.  Marland Kitchen     Appearance:  casually dressed.     Eye Contact:  good.     Behavior:  cooperative.     Attention:  good.     Speech:  normal rate and volume.     Motor:  no psychomotor retardation or agitation.     Mood:  anxious.     Affect:  appears anxious.     Thought Process:  goal oriented.     Thought Content:  no paranoia or delusions.     Suicidal Ideation:  none.     Homicidal Ideation:  none   Perception:  no hallucinations endorsed   Cognition:  concrete.     Insight:  fair.     Judgement:  fair.     Goals/wishes:  develop coping skills for anxiety.     Other objective findings:  none    ASSESSMENT:    MDD    PROCEDURE:  Used supportive dynamic therapy to encourage identification and expression of feeling states.  Reinforced patient's self-efficacy.  Encouraged increased understanding and acceptance of feelings.  Assisted patient in identifying unproductive defense mechanisms with more helpful adaptive coping strategies. Used Dialectical Behavior therapy to decrease emotion dysregulation, learn effective interpersonal relationship skills, and increase crisis survival skills.     Inquired for suicidal ideation/homicidal ideation. HAYTHAM VAIRO does not endorse current SI or HI.  * ALMIN MANWARREN has information and plan for safety and is willing to seek help if SI and/or HI  are present.    PLAN:    Return for outpatient individual therapy bi weekly as per therapy  treatment plan.     Latina Craver, LPC, 10/22/2018, 09:22

## 2018-10-23 ENCOUNTER — Encounter (HOSPITAL_COMMUNITY): Payer: Medicaid Other | Admitting: Psychiatry

## 2018-11-11 ENCOUNTER — Ambulatory Visit (INDEPENDENT_AMBULATORY_CARE_PROVIDER_SITE_OTHER): Payer: Medicaid Other | Admitting: Psychiatry

## 2018-11-11 ENCOUNTER — Other Ambulatory Visit: Payer: Self-pay

## 2018-11-11 DIAGNOSIS — F32A Depression, unspecified: Secondary | ICD-10-CM

## 2018-11-11 DIAGNOSIS — F329 Major depressive disorder, single episode, unspecified: Secondary | ICD-10-CM

## 2018-11-11 DIAGNOSIS — F419 Anxiety disorder, unspecified: Secondary | ICD-10-CM

## 2018-11-11 MED ORDER — SERTRALINE 100 MG TABLET
200.0000 mg | ORAL_TABLET | Freq: Every day | ORAL | 3 refills | Status: DC
Start: 2018-11-11 — End: 2019-03-26

## 2018-11-11 MED ORDER — BUPROPION HCL XL 150 MG 24 HR TABLET, EXTENDED RELEASE
150.0000 mg | ORAL_TABLET | Freq: Every day | ORAL | 3 refills | Status: DC
Start: 2018-11-11 — End: 2019-03-19

## 2018-11-11 MED ORDER — BUSPIRONE 30 MG TABLET
30.0000 mg | ORAL_TABLET | Freq: Two times a day (BID) | ORAL | 3 refills | Status: DC
Start: 2018-11-11 — End: 2019-03-26
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
## 2018-11-12 ENCOUNTER — Ambulatory Visit: Payer: Medicaid Other | Attending: Family Medicine | Admitting: Family Medicine

## 2018-11-12 ENCOUNTER — Encounter (HOSPITAL_BASED_OUTPATIENT_CLINIC_OR_DEPARTMENT_OTHER): Payer: Self-pay | Admitting: Family Medicine

## 2018-11-12 VITALS — BP 112/78 | HR 95 | Temp 97.0°F | Ht 66.0 in | Wt 214.5 lb

## 2018-11-12 DIAGNOSIS — J3489 Other specified disorders of nose and nasal sinuses: Secondary | ICD-10-CM

## 2018-11-12 DIAGNOSIS — Z79899 Other long term (current) drug therapy: Secondary | ICD-10-CM | POA: Insufficient documentation

## 2018-11-12 DIAGNOSIS — Z6834 Body mass index (BMI) 34.0-34.9, adult: Secondary | ICD-10-CM

## 2018-11-12 DIAGNOSIS — F39 Unspecified mood [affective] disorder: Secondary | ICD-10-CM

## 2018-11-12 DIAGNOSIS — F329 Major depressive disorder, single episode, unspecified: Secondary | ICD-10-CM | POA: Insufficient documentation

## 2018-11-12 DIAGNOSIS — I10 Essential (primary) hypertension: Secondary | ICD-10-CM

## 2018-11-12 DIAGNOSIS — F411 Generalized anxiety disorder: Secondary | ICD-10-CM | POA: Insufficient documentation

## 2018-11-12 DIAGNOSIS — R040 Epistaxis: Secondary | ICD-10-CM | POA: Insufficient documentation

## 2018-11-12 NOTE — Progress Notes (Signed)
Progress Note  Pt Name: Frederick Schultz  DOB: [birth year]  QIX:[id number]       Subjective:  [name] is a 38 y.o male presenting to clinic for a follow-up after initiating blood pressure medications for chronic HTN. Additionally pt has a PMH of MDD, and GAD. Since his last visit 40mo ago, he has been tolerating the new HTN medication without issue. Pt states that he has not been monitoring his BP at home but has had not any related symptoms of dizziness, syncope, or changes in vision. He states that his mood is good and did not endorse any changes depression or anxiety. Pt indicated concern for frequent epistaxis and dry nares.    ROS:   General: no fevers, weight loss/gain, night sweats    HEENT: complaints of dry nares and epistaxis. No changes in vision, dysphagia, or sore throat   Respiratory: No cough, SOB, DOE, or pleuritic pain    Cardio: No CP, palpitations or history of murmer   GU: No hematuria, dysuria, increased urgency or frequency    GI: No hematochezia, dyschezia, N/V/D or constipation   Endocrine: Increased thirst, no hot/cold intolerance    Integument: Chronic dry skin, no suspicious nevi, lesions or rashes   Musculoskeletal: No joint pain or weakness, no lower back pain   Neuro: no headaches, dizziness, syncope, or paraesthesias   Psych: no changes in mood, anxiety, or depression  Objective:   Vitals   Temp: 36.1C   BP: 112/78   RR: 18   Pulse: 95   Ht: 5'6"   Wt: 97.3kg   Physical   General: well appearing in no acute distress. Dressed appropriately   HEENT: arterial narrowing observed on omphalitic exam. Nasal septa were dry bilaterally with visible bleeding in kiesselbach triangle on the left. NT/AC. No cervical lymphadenopathy bilaterally    Respiratory: Lungs clear to ascultation in all fields bilaterally    Cardio: RRR normal S1 and S1 without murmer, gallop, rubs or thrills    Abdomen: Soft, non-distended, non-tender with active bowel sounds in all 4 quadrants    Neuro: Strength 5/5 bilaterally,  patellar reflexes 2+ bilaterally    Psych: Normal affect, no pressured speech      Assessment:  Pt is a 38 yo for follow-up for medication monitoring after starting new HTN medication. Pt's exam was unremarkable except for some bleeding noted on the nasal septum. Pt's chronic illnesses of HTN, MDD, GAD are well controled.  Plan:    HTN   Continue Norvasc at the same dosage. Monitor BP at home. F/u in 6 months for BP check.   MDD/GAD   Continue to monitor for changes in mood, anxiety or depression    Dry Nares   Apply Aquaphor, Vaseline or saline to nares to decrease dryness       Ancil Boozer, MED STUDENT MS3  11/12/2018, 16:44    I was present when the student was taking history, performing the exam, and during any medical decision making activities. I personally verified the history, performed the exam, and medical decision making as edited in the student's note. I agree with the medical student's note.     Bernita Buffy, MD

## 2018-11-13 ENCOUNTER — Encounter (HOSPITAL_COMMUNITY): Payer: Medicaid Other | Admitting: Psychiatry

## 2018-11-19 ENCOUNTER — Encounter (HOSPITAL_COMMUNITY): Payer: Medicaid Other | Admitting: Professional

## 2018-12-16 ENCOUNTER — Encounter (HOSPITAL_COMMUNITY): Payer: Medicaid Other | Admitting: Psychiatry

## 2018-12-17 ENCOUNTER — Ambulatory Visit (HOSPITAL_BASED_OUTPATIENT_CLINIC_OR_DEPARTMENT_OTHER): Payer: Self-pay | Admitting: Nephrology

## 2019-03-12 ENCOUNTER — Other Ambulatory Visit (HOSPITAL_COMMUNITY): Payer: Self-pay | Admitting: Psychiatry

## 2019-03-19 ENCOUNTER — Ambulatory Visit (HOSPITAL_COMMUNITY): Payer: Self-pay | Admitting: Student in an Organized Health Care Education/Training Program

## 2019-03-19 MED ORDER — BUPROPION HCL XL 150 MG 24 HR TABLET, EXTENDED RELEASE
150.0000 mg | ORAL_TABLET | Freq: Every day | ORAL | 0 refills | Status: DC
Start: 2019-03-19 — End: 2019-03-26

## 2019-03-19 NOTE — Telephone Encounter (Signed)
Called patient with no response.  Left voicemail instructing patient to set up new appointment with new provider as previous provider is no longer here.   30 day prescription sent in to preferred pharmacy.

## 2019-03-19 NOTE — Telephone Encounter (Signed)
Regarding: Refill request  ----- Message from Loel Ro sent at 03/19/2019  1:21 PM EDT -----  Dr Baird Lyons - Dr Dominica Severin pt     [facility] called requesting a refill for the patient's medication.   Please call to advise.   Thank you        buPROPion (WELLBUTRIN XL) 150 mg extended release 24 hr tablet 30 Tab 3 11/11/2018 12/11/2018   Sig - Route: Take 1 Tab (150 mg total) by mouth Once a day for 30 days      Preferred [place], [place]    [place] [address]    Phone: [phone] Fax: [phone]    Not a 24 hour pharmacy; exact hours not known.

## 2019-03-26 ENCOUNTER — Telehealth: Payer: Medicaid Other | Admitting: Student in an Organized Health Care Education/Training Program

## 2019-03-26 DIAGNOSIS — F419 Anxiety disorder, unspecified: Secondary | ICD-10-CM

## 2019-03-26 DIAGNOSIS — F32A Depression, unspecified: Secondary | ICD-10-CM

## 2019-03-26 DIAGNOSIS — F329 Major depressive disorder, single episode, unspecified: Secondary | ICD-10-CM

## 2019-03-26 MED ORDER — PROPRANOLOL 10 MG TABLET
10.00 mg | ORAL_TABLET | Freq: Two times a day (BID) | ORAL | 2 refills | Status: DC
Start: 2019-03-26 — End: 2019-04-27

## 2019-03-26 MED ORDER — BUSPIRONE 7.5 MG TABLET
7.50 mg | ORAL_TABLET | Freq: Two times a day (BID) | ORAL | 0 refills | Status: DC
Start: 2019-03-26 — End: 2019-05-16

## 2019-03-26 MED ORDER — SERTRALINE 100 MG TABLET
200.0000 mg | ORAL_TABLET | Freq: Every day | ORAL | 3 refills | Status: DC
Start: 2019-03-26 — End: 2019-06-25
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
## 2019-04-16 ENCOUNTER — Telehealth: Payer: Medicaid Other | Admitting: Professional

## 2019-04-16 ENCOUNTER — Other Ambulatory Visit: Payer: Self-pay

## 2019-04-16 DIAGNOSIS — F331 Major depressive disorder, recurrent, moderate: Secondary | ICD-10-CM

## 2019-04-17 NOTE — Progress Notes (Signed)
BE[facility]AV[facility]AL MEDICINE, [place]  [place] [address]  [facility]  Telephone Visit    Name:  Frederick Schultz MRN: [id number]   Date:  04/16/2019 Age:   38 y.o.   TIME: [id number]  The patient/family initiated a request for telephone service.  Verbal consent for this service was obtained from the patient/family.    Last office visit in this department: 11/11/2018      Reason for call: Psychotherapy, client does not have access to camera and microphone for video visit and at this time is uncomfortable with coming into the office for an in person session.   Call notes:  [name] provided an update on what has occurred since the last session, including an increase in his anxiety and depression symptoms. [name] discussed his fears regarding COVID and how he has not left the house since March. He identified some of his irrational thoughts about contracting the virus and created a plan to get out of the house again, while maintaining safety.       ICD-10-CM    1. MDD (major depressive disorder), recurrent episode, moderate (CMS HCC) F33.1        Total provider time spent with the patient on the phone: 45 minutes.    Terance Ice, [facility]        Patient's location: Home [facility] [place] [address]   Patient/family aware of provider location: Yes  Patient/family consent for telephone visit: Yes  Interview and observation performed by: Terance Ice, LPC

## 2019-04-20 ENCOUNTER — Other Ambulatory Visit (HOSPITAL_BASED_OUTPATIENT_CLINIC_OR_DEPARTMENT_OTHER): Payer: Self-pay | Admitting: Family Medicine

## 2019-04-24 ENCOUNTER — Other Ambulatory Visit (HOSPITAL_BASED_OUTPATIENT_CLINIC_OR_DEPARTMENT_OTHER): Payer: Self-pay | Admitting: Family Medicine

## 2019-04-24 DIAGNOSIS — I10 Essential (primary) hypertension: Secondary | ICD-10-CM

## 2019-04-24 MED ORDER — AMLODIPINE 10 MG TABLET
10.0000 mg | ORAL_TABLET | Freq: Every day | ORAL | 3 refills | Status: DC
Start: 2019-04-24 — End: 2019-09-22

## 2019-04-24 NOTE — Telephone Encounter (Signed)
Regarding: Rx  ----- Message from Laurann Montana sent at 04/24/2019  9:36 AM EDT -----  Doctors Name:Gregory Marlene Lard, MD     Hello!  Medication Requested:triamterene-hydroCHLOROthiazide (MAXZIDE) 75-50 mg Oral Tablet                                       amLODIPine (NORVASC) 10 mg Oral Tablet    Preferred [place], [place]    [place] [address]    Phone: [phone] Fax: [phone]    Not a 24 hour pharmacy; exact hours not known.     Thanks!  Laurann Montana

## 2019-04-27 ENCOUNTER — Other Ambulatory Visit (HOSPITAL_COMMUNITY): Payer: Self-pay | Admitting: Student in an Organized Health Care Education/Training Program

## 2019-04-27 MED ORDER — PROPRANOLOL 10 MG TABLET
10.00 mg | ORAL_TABLET | Freq: Two times a day (BID) | ORAL | 2 refills | Status: DC
Start: 2019-04-27 — End: 2019-04-30

## 2019-04-27 NOTE — Telephone Encounter (Signed)
Reviewed VS and med list in EPIC.  Called pharmacy x 2 to clarify reason for propranolol not being filled to assess if another provider discontinued the script vs another reason.  Pharmacist on lunch both times. Propranolol reordered.    Storm Frisk, MD

## 2019-04-30 ENCOUNTER — Telehealth: Payer: Medicaid Other | Admitting: Student in an Organized Health Care Education/Training Program

## 2019-04-30 ENCOUNTER — Other Ambulatory Visit: Payer: Self-pay

## 2019-04-30 DIAGNOSIS — F329 Major depressive disorder, single episode, unspecified: Secondary | ICD-10-CM

## 2019-04-30 DIAGNOSIS — F419 Anxiety disorder, unspecified: Secondary | ICD-10-CM

## 2019-04-30 DIAGNOSIS — F331 Major depressive disorder, recurrent, moderate: Secondary | ICD-10-CM

## 2019-04-30 MED ORDER — PROPRANOLOL 10 MG TABLET
10.00 mg | ORAL_TABLET | Freq: Three times a day (TID) | ORAL | 2 refills | Status: DC
Start: 2019-04-30 — End: 2019-06-25

## 2019-04-30 NOTE — Progress Notes (Signed)
I personally offered the service to the patient, and obtained verbal consent to provide this service.    Storm Frisk, MD     BEHAVIORAL MEDICINE, [place] [place]  [place] [address]    Virtual Phone Check-in        Name: Frederick Schultz  MRN: [id number]    Date: 04/30/2019  Age: 38 y.o.       The patient/family initiated a request for virtual check-in and is established with this practice.  Verbal consent for this service was obtained from the patient/family.     Last office visit in this department: Less than 12 months ago.     REASON FOR CALL:  Telemed phone visit instead of face to face appointment due to Ambulatory precautions secondary to the COVID-19 pandemic.      CALL NOTES:  Chief complaint: Med check    Subjective:  Frederick Schultz is a 38 y.o. male presenting for medication management for depression.  Patient was last evaluated through [facility] on 03/26/2019 by this Probation officer.  At last visit, propranolol was started and Buspar was tapered.      Since last appointment:   Patient reports that he is unable to sleep well which he indicates is a major problem for him. Sleep oscillates. Reports sleeping 10-12 hours some days.  Has problems with sleep initiation.  Tried melatonin the in the past.  Helped initially and then stopped.      Mood: poor  Anxiety: less than before    Side-effects/ROS:   Denies dizziness/lightheadedness  Denies palpitations, chest pain  Denies nausea, vomiting, diarrhea     Collateral: None obtained    Psych hx:  Attempted suicide at 45 and was hospitalized for 2 weeks for this.    Medication Trials: Has tried Prozac (made "hyper" per patient as a child), Lithium (felt effective but was taken off due to concerns for potential long term side effects), Depakote, Celexa (does not remember), Effexor (does not remember - does have hx of HTN),trazodone - took once (got priapism), Buspar ineffective, Wellbutrin - lack of efficacy; vistaril - perceived lack of efficacy    Outpatient  medications:  Current Outpatient Medications   Medication Sig   . amLODIPine (NORVASC) 10 mg Oral Tablet Take 1 Tab (10 mg total) by mouth Once a day   . busPIRone (BUSPAR) 7.5 mg Oral Tablet Take 1 Tab (7.5 mg total) by mouth Twice daily for 7 days After this script will be successfully tapered off.   . propranoloL (INDERAL) 10 mg Oral Tablet Take 1 Tab (10 mg total) by mouth Twice daily   . sertraline (ZOLOFT) 100 mg Oral Tablet Take 2 Tabs (200 mg total) by mouth Once a day   . triamterene-hydroCHLOROthiazide (MAXZIDE) 75-50 mg Oral Tablet Take 1 Tab by mouth Once a day        Objective (limited by phone modality):    Mental Status Exam:  Speech with appropriate rate, volume, rhythm, prosody. Mood "low". Thought process linear and goal-directed. Denies SI.  No thoughts of self-harm nor HI expressed. No apparent delusions. No voiced AVH. Attention/concentration adequate for conversation. Grossly oriented. Recent and remote memory not formally assessed. No word-finding issues or paraphasic errors. Insight and judgment good.    Assessment:  Frederick Schultz is a 38 y.o. male with MDD and unspecified anxiety disorder (patient possesses some traits suggestive of obsessive-compulsive spectrum as well as the stress/trauma related spectrum however not sufficient information to make  a DSM-5 based diagnoses at this time) who continues to struggle with both anxiety as well as depression.  Feels that his anxiety is fueling his depression.  While patient did not have a full trial of Wellbutrin (did not go beyond 150mg  DAILY), given that patient identified that anxiety as the driving force, this was not resumed and Buspar (patient feels it is ineffective) was switched to propanolol to target anxiety as well as chronic feeling of restlessness.  Low suspicion for iron deficiency anemia at this time.      Sleep hygiene was discussed.  Will further increase propranolol frequency from 10 mg BID to TID to further target feelings of  restlessness.  No acute safety concerns at this time.    Psychiatric Diagnoses:   MDD and unspecified anxiety disorder    Plan:    Discussed risks, benefits, and alternatives. Patient agreeable to plan as described below.   Medications:  Orders Placed This Encounter   . propranoloL (INDERAL) 10 mg Oral Tablet     No longer on Buspar.  Continue Zoloft 200 mg DAILY for mood and anxiety   Increasing Propranolol from 10 mg BID to 10 mg TID  The risks and benefits and alternatives of this medication were discussed, including but not limited to: hypotension, bradycardia, and sedation.    Laboratory Studies:  Most recent labs reviewed, No new labs needed at this time     Therapy:  Had followed with Mordecai Rasmussenaitlin Hannah - recently starting having visits again       Follow up:  - Follow up planned for Oct 8th at 2:30 via phone  - Limiting face-to-face interactions as able to limit potential exposure in context to ongoing COVID 19 pandemic.  - Patient advised to call the call center or send MyChart message with any questions or concerns.   - Patient advised to report to nearest emergency department or to call 911 if having any suicidal or homicidal ideations.    Staffed with attending:  Dr. Christena Deemhandran via phone       [name], MD   04/30/2019      TOTAL PROVIDER TIME SPENT WITH PATIENT ON THE PHONE: 21 min.      Late Entry for 04/30/2019.  I spoke with the patient from 15:15 to 15:20 for a total of 5 minutes on the telephone.  The telephone visit was conducted secondary to the pandemic response to COVID-19.  I was present and participated in the development of the treatment plan. I reviewed the resident's note. I agree with the findings and plan of care, as documented in the resident's note.  Any exceptions/ additions are edited/noted.    Kendall Arnell N. Christena Deemhandran, M.D.   Staff Psychiatrist     Gerad Cornelio Marcille BuffyNarayan Davontay [place], MD  05/19/2019, 11:37

## 2019-05-15 ENCOUNTER — Ambulatory Visit: Payer: Medicaid Other | Attending: Family Medicine | Admitting: Family Medicine

## 2019-05-15 DIAGNOSIS — M6281 Muscle weakness (generalized): Secondary | ICD-10-CM | POA: Insufficient documentation

## 2019-05-15 DIAGNOSIS — M549 Dorsalgia, unspecified: Secondary | ICD-10-CM

## 2019-05-15 MED ORDER — MELOXICAM 15 MG TABLET
15.0000 mg | ORAL_TABLET | Freq: Every day | ORAL | 2 refills | Status: DC
Start: 2019-05-15 — End: 2019-08-25

## 2019-05-15 NOTE — Progress Notes (Signed)
FAMILY MEDICINE, [place]  [place] [id number]  Operated by [facility]  Telephone Visit    Name:  Frederick Schultz MRN: [id number]   Date:  05/15/2019 Age:   38 y.o.     The patient/family initiated a request for telephone service.  Verbal consent for this service was obtained from the patient/family.    Last office visit in this department: 11/12/2018      Reason for call: back pain  Call notes:  Feels around waist and upper torso- like it is overtaxed  More than anything in vicinity of waist  But some upper back pain also and chest discomfort  - seems to be muscular and not heart  Feels strange - worn out - muscle weakness  Not unusual sx for him - has generally been able to ignore it   But seems worse in last two months- but a problem for 2 years  No blood in urine  No urinary sxs no blood  No change in bowel habits  Achy  No morning stiffness  Had knee pain years ago but cleared up   No swelling noted  Mood a little down in last two months but seeing psychiatry    Fam hx mom with back problems  And she had gall bladder disease    Asked him to check BP and send it in    Ros: no cp or sob or bleeding      ICD-10-CM    1. Muscle weakness  M62.81 COMPREHENSIVE METABOLIC PANEL, NON-FASTING     CREATINE KINASE (CK), TOTAL, SERUM     THYROID STIMULATING HORMONE WITH FREE T4 REFLEX     XR LUMBAR SPINE SERIES     Refer to Physical Therapy-EXTERNAL   2. Back pain, unspecified back location, unspecified back pain laterality, unspecified chronicity  M54.9 XR LUMBAR SPINE SERIES     URINALYSIS, MACROSCOPIC AND MICROSCOPIC W/CULTURE REFLEX     Refer to Physical Therapy-EXTERNAL     Orders Placed This Encounter   . XR LUMBAR SPINE SERIES   . XR THORACIC SPINE   . COMPREHENSIVE METABOLIC PANEL, NON-FASTING   . CREATINE KINASE (CK), TOTAL, SERUM   . THYROID STIMULATING HORMONE WITH FREE T4 REFLEX   . URINALYSIS, MACROSCOPIC AND MICROSCOPIC W/CULTURE REFLEX   . Refer to Physical  Therapy-EXTERNAL     F/u with me in a couple weeks for failure to improve or worsening sxs  Otherwise Dr Ebony Hail in 3 mo or so    Total provider time spent with the patient on the phone: 30 minutes 32 sec    Konrad Saha, MD

## 2019-05-15 NOTE — Progress Notes (Signed)
Faxed PT order to [facility] at [phone], confirmation received at 11:20 AM on 05/15/19. splummer

## 2019-05-15 NOTE — Nursing Note (Signed)
Mailed Physical Therapy script to patient.   IXL, [place]  05/15/2019, 13:35

## 2019-05-20 ENCOUNTER — Telehealth: Payer: Medicaid Other | Admitting: Professional

## 2019-05-20 ENCOUNTER — Other Ambulatory Visit: Payer: Self-pay

## 2019-05-20 DIAGNOSIS — F331 Major depressive disorder, recurrent, moderate: Secondary | ICD-10-CM

## 2019-05-21 NOTE — Progress Notes (Signed)
BEHAVIORAL MEDICINE, [place] [place]  [place] [id number]  [facility]  Telephone Visit    Name:  Frederick Schultz MRN: [id number]   Date:  05/20/2019 Age:   38 y.o.   TIME: [id number]  The patient/family initiated a request for telephone service.  Verbal consent for this service was obtained from the patient/family.    Last office visit in this department: 11/11/2018      Reason for call: Psychotherapy, client does not have access to camera and microphone for video visit and at this time is uncomfortable with coming into the office for an in person session.   Call notes:  [name] provided an update on what has occurred since the last session, including his ability to complete his therapeutic homework of leaving his home safely to avoid isolation. Temple reports being able to see others following the guidelines has aided in him feeling better. We discussed his increase in anger and frustration; we reviewed a 6 step anger management technique to utilize in managing his anger.       ICD-10-CM    1. MDD (major depressive disorder), recurrent episode, moderate (CMS HCC)  F33.1        Total provider time spent with the patient on the phone: 45 minutes.    Terance Ice, [facility]        Patient's location: Home [facility] [place] [address]   Patient/family aware of provider location: Yes  Patient/family consent for telephone visit: Yes  Interview and observation performed by: Terance Ice, LPC

## 2019-06-17 ENCOUNTER — Telehealth (HOSPITAL_COMMUNITY): Payer: Medicaid Other | Admitting: Professional

## 2019-06-17 ENCOUNTER — Ambulatory Visit (HOSPITAL_COMMUNITY): Payer: Self-pay | Admitting: Professional

## 2019-06-17 ENCOUNTER — Other Ambulatory Visit: Payer: Self-pay

## 2019-06-18 ENCOUNTER — Telehealth (HOSPITAL_COMMUNITY): Payer: Self-pay | Admitting: Student in an Organized Health Care Education/Training Program

## 2019-06-25 ENCOUNTER — Other Ambulatory Visit: Payer: Self-pay

## 2019-06-25 ENCOUNTER — Telehealth: Payer: Medicaid Other | Admitting: Student in an Organized Health Care Education/Training Program

## 2019-06-25 DIAGNOSIS — F329 Major depressive disorder, single episode, unspecified: Secondary | ICD-10-CM

## 2019-06-25 DIAGNOSIS — F419 Anxiety disorder, unspecified: Secondary | ICD-10-CM

## 2019-06-25 MED ORDER — PROPRANOLOL 10 MG TABLET
10.00 mg | ORAL_TABLET | Freq: Three times a day (TID) | ORAL | 2 refills | Status: DC
Start: 2019-06-25 — End: 2020-04-06

## 2019-06-25 MED ORDER — ESCITALOPRAM 20 MG TABLET
20.0000 mg | ORAL_TABLET | Freq: Every day | ORAL | 2 refills | Status: DC
Start: 2019-06-25 — End: 2019-08-13

## 2019-06-25 NOTE — Progress Notes (Signed)
I personally offered the service to the patient, and obtained verbal consent to provide this service.    Ferd Hibbs, MD       BEHAVIORAL MEDICINE, [place]  [address]  [place] [address]  Virtual Phone Check-in      Name: Frederick Schultz  MRN: [id number]    Date: 06/25/2019  Age: 38 y.o.       The patient/family initiated a request for virtual check-in and is established with this practice.  Verbal consent for this service was obtained from the patient/family.     Last office visit in this department: Less than 12 months ago.     REASON FOR CALL:  Telemed phone visit instead of face to face appointment due to Ambulatory precautions secondary to the COVID 19 pandemic.      CALL NOTES:  Chief complaint: Med check    Subjective:  Frederick Schultz is a 38 y.o. male presenting for medication management for MDD and anxiety.  Patient was last evaluated through [facility] on 04/30/19 by this Clinical research associate.  At last visit, propranolol was increased from 10mg  BID to 10mg  TID and Zoloft was continued.      Since last appointment:   Patient reports that he has been sleeping through his alarms.  Patient struggles to keep track of time and day.  Patient reports anhedonia and lack of motivation.  Patient indicates that he does not work and that he used to enjoy college but that this ultimately did not work out.  Reports leaving home once a month and that he fears getting panic attacks when leaving.    Side-effects/ROS:   Denies dizziness/lightheadedness  Denies palpitations, chest pain  Denies nausea, vomiting, diarrhea    Collateral: None obtained  Psychhx: Attempted suicide at 48 and was hospitalized for 2 weeks for this.  Medication Trials:Has tried Prozac(made "hyper" per patient as a child), Lithium(felt effective but was taken off due to concerns for potential long term side effects),Depakote, Celexa(does not remember), Effexor(does not remember - does have hx of HTN),trazodone - took once (got priapism), Buspar  ineffective, Wellbutrin - lack of efficacy; vistaril - perceived lack of efficacy     Outpatient medications:  Current Outpatient Medications   Medication Sig   . amLODIPine (NORVASC) 10 mg Oral Tablet Take 1 Tab (10 mg total) by mouth Once a day   . meloxicam (MOBIC) 15 mg Oral Tablet Take 1 Tab (15 mg total) by mouth Once a day   . propranoloL (INDERAL) 10 mg Oral Tablet Take 1 Tab (10 mg total) by mouth Three times a day   . sertraline (ZOLOFT) 100 mg Oral Tablet Take 2 Tabs (200 mg total) by mouth Once a day   . triamterene-hydroCHLOROthiazide (MAXZIDE) 75-50 mg Oral Tablet Take 1 Tab by mouth Once a day        Objective (limited by phone modality):    Mental Status Exam:  Speech with appropriate rate, volume, rhythm, prosody. Mood "not good." Thought process linear and goal-directed. Denies SI.  No thoughts of self-harm nor HI expressed. No apparent delusions. No voiced AVH. Attention/concentration adequate for conversation. Grossly oriented. Recent and remote memory not formally assessed. No word-finding issues or paraphasic errors. Insight and judgment fair to good.    Assessment:  Frederick Schultz is a 38 y.o. male with MDD and unspecified anxiety disorder (patient possesses some traits suggestive of obsessive-compulsive spectrum as well as the stress/trauma related spectrum however not sufficient information to make  a DSM-5 based diagnoses at this time) who continues to struggle with both anxiety as well as depression. Feels that his anxiety is fueling his depression. While patient did not have a full trial of Wellbutrin (did not go beyond 150mg  DAILY), given that patient identified that anxiety as the driving force, this was not resumed and Buspar (patient feels it is ineffective) was switched to propanolol to target anxiety as well as chronic feeling of restlessness. Low suspicion for iron deficiency anemia at this time.     Patient indicates that anxiety and depression remain elevated.  Reports RLS-type  sensations at night.  Indicates this has been an ongoing problem but also indicates having been on Zoloft for a long time.  Discussed potential that these may be a side effect of the Zoloft.  Will transition over from Zoloft to Lexapro in case RLS-type sensations are medication induced.  NO acute safety concerns at this time.    Psychiatric Diagnoses: MDD and unspecified anxiety disorder (suspect panic disorder vs GAD with panic)    Plan:    Discussed risks, benefits, and alternatives. Patient agreeable to plan as described below.   Medications:  No orders of the defined types were placed in this encounter.      Crosstitrating Zoloft to Lexapro.  Instructions provided to patient on how to preform this.  Target dose of Lexapro 20mg  QD given extend and severity of anxiety as well as past SSRI dosing requirement.  The risks and benefits and alternatives of Serotonin Selective Reuptake Inhibitors were discussed, including but not limited to: nausea and gastrointestinal difficulties, the risk of activation and manic shifts, other common side effects.  Continue propranolol 10mg  TID    Laboratory Studies:  Most recent labs reviewed, No new labs needed at this time     Therapy:  Follows with Threasa Heads, [facility] for therapy  Experience validated and supportive presence provided      Follow up:  - Follow up planned for Follow up Dec 3rd at 2pm via [place] video.  - Limiting face-to-face interactions as able to limit potential exposure in context to ongoing COVID 19 pandemic.  - Patient advised to call the call center or send mychart message with any questions or concerns.   - Patient advised to report to nearest emergency department or to call 911 if having any suicidal or homicidal ideations.    Staffed with attending:  Dr. Marguerita Beards via phone     Storm Frisk, MD       [place] PHONE: 25 min.    Late Entry 06-25-19. I spoke with the patient (telephone). I was present and participated in the  development of the treatment plan. I reviewed the resident's note. I agree with the findings and plan of care as documented in the resident's note.  Any exceptions/ additions are edited/noted.    Glennie Hawk, MD    Attending phone time: 5 minutes

## 2019-06-25 NOTE — Patient Instructions (Addendum)
MyChart Video Visit  Patient Instructions    For a YouTube video guide:  [email or web address].youtube.com/watch?[email or web address]    For Immediate IT Support from [facility]:  Please call [phone] for any issues with logging in to your MyChart Video Visit.    Option #3 gives information  Option #7 allows you to speak with a member of our IT support team, who will be able to assist you    Before logging in for your video visit with your mobile device (preferred method):  1. Make sure you have a good connection to internet or 3G/4G network.    2. Download the MyChart app from the App Store (Apple) or the Google Play Store (Android).    3. Enter "[place]" in the search field at the top of your screen, or allow the MyChart app to  access your device's location.    4. Select [facility] when prompted to "Select an Organization."    5. Login using your [facility] username and password. See MyChart account setup section, if  you do not have an account.    6. For your first video visit, the device you are using may ask if you want to allow the MyChart  program to use the camera and the microphone. You must select Yes or Allow for the video visit  service to work.    Joining the video visit through mobile device (preferred method):  1. Enter your [facility] username and password.    2. Select Sign In.    3. Select Appointments.    4. Select your video visit appointment from the Upcoming Appointments screen.    5. Complete the E-Check in process by reviewing and, if needed, updating your personal and  contact information, allergies, medication(s), symptoms, or other concerns.    6. Select Review and Sign to open the consent.    7. Read and sign the consent, then select Continue.    8. The Video Visit Questionnaire should appear.

## 2019-08-05 ENCOUNTER — Other Ambulatory Visit (HOSPITAL_BASED_OUTPATIENT_CLINIC_OR_DEPARTMENT_OTHER): Payer: Self-pay | Admitting: Family Medicine

## 2019-08-10 NOTE — Telephone Encounter (Signed)
Patient needs to complete labs and Xrays ordered in September prior to any refills of this medication. The last labs we have indicated his creatinine was elevated and this needs checked prior to continuing this medication

## 2019-08-13 ENCOUNTER — Encounter (HOSPITAL_COMMUNITY): Payer: Self-pay

## 2019-08-13 ENCOUNTER — Telehealth: Payer: Medicaid Other | Admitting: Student in an Organized Health Care Education/Training Program

## 2019-08-13 DIAGNOSIS — F332 Major depressive disorder, recurrent severe without psychotic features: Secondary | ICD-10-CM

## 2019-08-13 DIAGNOSIS — F419 Anxiety disorder, unspecified: Secondary | ICD-10-CM

## 2019-08-13 MED ORDER — DULOXETINE 30 MG CAPSULE,DELAYED RELEASE
30.0000 mg | DELAYED_RELEASE_CAPSULE | Freq: Every day | ORAL | 0 refills | Status: DC
Start: 2019-08-13 — End: 2019-08-25

## 2019-08-13 MED ORDER — DULOXETINE 60 MG CAPSULE,DELAYED RELEASE
60.0000 mg | DELAYED_RELEASE_CAPSULE | Freq: Every day | ORAL | 2 refills | Status: DC
Start: 2019-08-19 — End: 2019-11-17

## 2019-08-13 NOTE — Progress Notes (Signed)
I personally offered the service to the patient, and obtained verbal consent to provide this service.    Frederick HibbsJoy Parks, MD    BEHAVIORAL MEDICINE, [place]  [address]  [facility] [place] [id number]  Virtual Phone Check-in   Patient indicated he was unable to connect to video today and expressed desire to proceed with telephone.     Name: Frederick Schultz  MRN: [id number]    Date: 08/13/2019  Age: 38 y.o.       The patient/family initiated a request for virtual check-in and is established with this practice.  Verbal consent for this service was obtained from the patient/family.     Last office visit in this department: Less than 12 months ago.     REASON FOR CALL:  Telemed phone visit instead of face to face appointment due to Ambulatory precautions secondary to the COVID 19 pandemic.      CALL NOTES:  Chief complaint: Med check    Subjective:  Frederick Schultz is a 38 y.o. male presenting for medication management for MDD and anxiety.  [facility] by this Clinical research associatewriter.  At last visit, Zoloft was switched to Lexapro.     Since last appointment:   Patient transitioned from Zoloft to Lexapro.  No longer having RLS-esque symptoms since this change.  Patient sleeping 3-4 hours a night and taking several naps.  Feels that he is waking up earlier.  Rare nightmares.  Feels that he sleeps 10-12 hour a day.  Patient reports he uses sleep as an escape.  Has not been having panic attacks.  Only leaving home once a month.  Minimal interaction with others.    Patient perceives that others do not enjoy his company due to feeling they make excuses to not be with him.  Reports feeling "life is a series of failures".  Has worsening anhedonia.  No SI.  Patient denies any history of manic or hypomanic episodes.   Reports having 1-2 day periods of being happier and not sleeping as much, but denies other that would suggest history of mania or hypomania.      Patient expressed feeling that lithium was the best  med he has been on.    Side-effects/ROS:   Denies dizziness/lightheadedness  Denies palpitations, chest pain  Denies nausea, vomiting, diarrhea    Collateral: EMR  Psychhx: Attempted suicide at [age] and was hospitalized for 2 weeks for this.  Medication Trials:Has tried Prozac(made "hyper" per patient as a child), Lithium(felt effective but was taken off due to concerns for potential long term side effects - denies ever having side effects or elevated levels on this),Depakote, trazodone - took once (got priapism), Buspar ineffective, Wellbutrin - lack of efficacy; vistaril - perceived lack of efficacy; Zoloft - RLS-type sensations  Medical hx:  Back pain; HTN  Social:  Primarily stays at home.  Does live action role play.  Infrequent alcohol use with no alcohol in last 1-2 months.      Outpatient medications:    Current Outpatient Medications   Medication Sig   . amLODIPine (NORVASC) 10 mg Oral Tablet Take 1 Tab (10 mg total) by mouth Once a day   . escitalopram oxalate (LEXAPRO) 20 mg Oral Tablet Take 1 Tab (20 mg total) by mouth Once a day Take half a pill (10mg ) daily for 1 week then increase to a full pill (20mg ) daily thereafter   . meloxicam (MOBIC) 15 mg Oral Tablet Take 1  Tab (15 mg total) by mouth Once a day   . propranoloL (INDERAL) 10 mg Oral Tablet Take 1 Tab (10 mg total) by mouth Three times a day   . triamterene-hydroCHLOROthiazide (MAXZIDE) 75-50 mg Oral Tablet Take 1 Tab by mouth Once a day        Objective (limited by phone modality):    Mental Status Exam:  Speech with appropriate rate, volume, rhythm, prosody. Mood "about the same" Thought process linear and goal-directed. Denies SI.  No thoughts of self-harm nor HI expressed. No apparent delusions. No voiced AVH. Attention/concentration adequate for conversation. Grossly oriented. Recent and remote memory not formally assessed. No word-finding issues or paraphasic errors. Insight and judgment fair to good.    Assessment:  Frederick Schultz is a  38 y.o. male with MDD and unspecified anxiety disorder (patient possesses some traits suggestive of obsessive-compulsive spectrum as well as the stress/trauma related spectrum however not sufficient information to make a DSM-5 based diagnoses at this time) who continues to struggle with both anxiety as well as depression. Feels that his anxiety is fueling his depression. While patient did not have a full trial of Wellbutrin (did not go beyond 150mg  DAILY), given that patient identified that anxiety as the driving force, this was not resumed and Buspar (patient feels it is ineffective) was switched to propanolol to target anxiety as well as chronic feeling of restlessness.    Patient's RLS-type sensations at night resolved with transition from Zoloft to Lexapro suggesting medication induced process from the Zoloft.  Despite Lexapro being better tolerated, patient continues to struggle with anxiety and depression. Given patient has failed multiple SSRI trials, will proceed with Cymbalta to address his mood and anxiety symptoms as well as the potential to improve his back pain.  No acute safety concerns at this time.    Psychiatric Diagnoses: MDD, recurrent, severe, without psychotic features; and unspecified anxiety disorder (suspect panic disorder vs GAD with panic vs agoraphobia)    Plan:    Discussed risks, benefits, and alternatives. Patient agreeable to plan as described below.   Medications:  Orders Placed This Encounter   . DULoxetine (CYMBALTA DR) 30 mg Oral Capsule, Delayed Release(E.C.)   . DULoxetine (CYMBALTA DR) 60 mg Oral Capsule, Delayed Release(E.C.)     Crosstitrating Lexapro to Cymbalta:  10mg  Lexapro QD  with 30mg  QD of Cymbalta x 7 days followed by stopping Lexapro and increasing to Cymbalta 60mg  QD thereafter  The risks and benefits and alternatives of duloxetine (Cymbalta) were discussed, including but not limited to: nausea and gastrointestinal difficulties, potential to increase blood  pressure, potential for liver problems, the risk of activation and manic shifts, and other common side effects.    Continue propranolol 10mg  TID for anxiety    Laboratory Studies:  Most recent labs reviewed, No new labs needed at this time     Therapy:  Follows with , [facility] for therapy, but has not had an appointment in several months.  Experience validated and supportive presence provided    Follow up:  - Follow up planned for Follow up in 6-8 weeks via mychart video.  - Limiting face-to-face interactions as able to limit potential exposure in context to ongoing COVID 19 pandemic.  - Patient advised to call the call center or send mychart message with any questions or concerns.   - Patient advised to report to nearest emergency department or to call 911 if having any suicidal or homicidal ideations.    Staffed with  attending:  Dr. Arliss Journey via phone     Storm Frisk, MD       [place] PHONE: 26 min.    I talked to the patient over phone.  I was present and participated in the development of the treatment plan with the resident.  I reviewed the resident's note. I agree with the findings and plan of care as documented in the resident's note.    Any exceptions/ additions are edited/noted.        Sherlyn Lick, MD  06/30/2019, 08:49  Staff (Child & Adolescent) Psychiatrist  [facility]

## 2019-08-25 ENCOUNTER — Other Ambulatory Visit: Payer: Self-pay

## 2019-08-25 ENCOUNTER — Ambulatory Visit: Payer: Medicaid Other | Attending: Family Medicine | Admitting: Family Medicine

## 2019-08-25 DIAGNOSIS — I10 Essential (primary) hypertension: Secondary | ICD-10-CM

## 2019-08-25 DIAGNOSIS — M549 Dorsalgia, unspecified: Secondary | ICD-10-CM | POA: Insufficient documentation

## 2019-08-25 MED ORDER — NAPROXEN 500 MG TABLET
500.00 mg | ORAL_TABLET | Freq: Two times a day (BID) | ORAL | 5 refills | Status: DC | PRN
Start: 2019-08-25 — End: 2021-04-10

## 2019-08-25 NOTE — Progress Notes (Signed)
FAMILY MEDICINE, [place] [address]  Operated by [facility]  Telephone Visit    Name:  Frederick Schultz MRN: [id number]   Date:  08/25/2019 Age:   38 y.o.     The patient/family initiated a request for telephone service.  Verbal consent for this service was obtained from the patient/family.    Last office visit in this department: 11/12/2018      Reason for call: Back Pain      Call notes:  Frederick Schultz is a 38 y.o. male with a past medical history of HTN, HLD, mood disorder (MDD and GAD) and back pain presents for a virtual check-in via telephone to reestablish care for chronic medical conditions after PCP retired and discuss chronic back pain.    In regards to his back pain, he feels as though his body cannot hold his own weight.  There is always a mild degree of pain that can be exacerbated from time to time. No falls. Feels strained at times.  He has been staying home a lot. Has not been doing much exercise. Does not work. Pain does not radiate down the leg, does go into chest occasionally.    He does find that he sits in the same position or standing for a long time makes it worse. He does do a lot of sitting. Spends a lot of time at the computer.     Does try to cook at home 3 times per week     Tried meloxicam that did not help. Has taken APAP in the past but does not really help all that much.     BP has been consistently normal. Takes amlodipine 10mg  daily and triamterene-HCTZ 75-50mg  daily. No adverse side effects.     Review of Systems:  Pertinent positives and negatives as per HPI, otheriwse 10 point review of systems is negative    Assessment and Plan:  1. Back pain, unspecified back location, unspecified back pain laterality, unspecified chronicity   -- Persistent  -- Start naproxen 500mg  every 12hrs as needed      2. Essential hypertension   -- Asymptomatic   -- Cont amlodipine 10mg  daily   -- Cont triamterene-HCTZ 75-50mg  daily              follow-up: Return in about 4 weeks (around 09/22/2019) for recheck back pain via virtual check in.      Total provider time spent with the patient on the phone: 13 minutes.      Elon Alas, DO  08/25/2019, 16:05

## 2019-09-03 ENCOUNTER — Encounter (HOSPITAL_BASED_OUTPATIENT_CLINIC_OR_DEPARTMENT_OTHER): Payer: Self-pay

## 2019-09-22 ENCOUNTER — Ambulatory Visit: Payer: Medicaid Other | Attending: Family Medicine | Admitting: Family Medicine

## 2019-09-22 ENCOUNTER — Other Ambulatory Visit: Payer: Self-pay

## 2019-09-22 ENCOUNTER — Encounter

## 2019-09-22 DIAGNOSIS — Z79899 Other long term (current) drug therapy: Secondary | ICD-10-CM | POA: Insufficient documentation

## 2019-09-22 DIAGNOSIS — F39 Unspecified mood [affective] disorder: Secondary | ICD-10-CM | POA: Insufficient documentation

## 2019-09-22 DIAGNOSIS — M549 Dorsalgia, unspecified: Secondary | ICD-10-CM | POA: Insufficient documentation

## 2019-09-22 DIAGNOSIS — I10 Essential (primary) hypertension: Secondary | ICD-10-CM | POA: Insufficient documentation

## 2019-09-22 MED ORDER — TRIAMTERENE 75 MG-HYDROCHLOROTHIAZIDE 50 MG TABLET
1.0000 | ORAL_TABLET | Freq: Every day | ORAL | 3 refills | Status: DC
Start: 2019-09-22 — End: 2020-10-22

## 2019-09-22 MED ORDER — AMLODIPINE 10 MG TABLET
10.0000 mg | ORAL_TABLET | Freq: Every day | ORAL | 3 refills | Status: DC
Start: 2019-09-22 — End: 2020-11-03

## 2019-09-22 NOTE — Progress Notes (Signed)
FAMILY MEDICINE, [place] [facility]  [address]  [place] [address]  Operated by [facility]  Telephone Visit    Name:  Frederick Schultz MRN: [id number]   Date:  09/22/2019 Age:   39 y.o.     The patient/family initiated a request for telephone service.  Verbal consent for this service was obtained from the patient/family.    Last office visit in this department: 11/12/2018      Reason for call: Establish Care, Hypertension, and Back Pain      Call notes:  Frederick Schultz is a 39 y.o. male with a past medical history of HTN, mood disorder (GAD and MDD) and chronic back pain presents for a virtual check-in via telephone to reestablish care for HTN and back pain after PCP retired.    The back is about the same. He finds that staying in one position for too long exacerbates the back pain. Pain is in the low back and sternum. This pain has been going on for a long time. He cannot recall a time he did not have the pain or a time when a medication took the pain away.     In terms of his hypertension, he does occasionally check his BP. Finds SBP high 120s, low 130, DBP not as much variability. This is lower than it was before he went on medications. He is taking amlodipine 10mg  daily and triamterene-HCTZ 75-50mg  daily    In terms of his mood, his mood is pretty stable. Follows with [facility]. Taking duloxetine 60mg  daily and propranolol to off set the restless leg symptoms that are associated with his snri use.    Review of Systems:  Pertinent positives and negatives as per HPI, otheriwse 10 point review of systems is negative      Assessment and Plan:  1. Essential hypertension   -- Improved control per .  -- Serologies ordered in Sept are pending (CMP, TSH, CPK, and UA)  -- Refill triamterene 75-50mg  daily  -- Refill amlodipine 10mg  daily      2. Back pain, unspecified back location, unspecified back pain laterality, unspecified chronicity   -- Slight improvement with rotation of NSAIDs to  naproxen.   -- Previous Xrays have not been completed. Will update sternum Xray in addition to axial spine imaging  -- Cont naproxen 500mg  BID     3. Mood disorder (CMS HCC)   -- Stable. Follows with Behavioral Medicine  -- Cont duloxetine 60mg  daily   -- Cont propranolol 10mg  TID       follow-up: Return in about 4 months (around 01/20/2020) for recheck HTN and back pain in the office.      Total provider time spent with the patient on the phone: 11 minutes.      06-29-1977, DO  09/22/2019, 15:38

## 2019-11-16 ENCOUNTER — Other Ambulatory Visit (HOSPITAL_COMMUNITY): Payer: Self-pay | Admitting: Student in an Organized Health Care Education/Training Program

## 2019-12-01 ENCOUNTER — Encounter (HOSPITAL_BASED_OUTPATIENT_CLINIC_OR_DEPARTMENT_OTHER): Payer: Self-pay

## 2019-12-07 ENCOUNTER — Ambulatory Visit (VACCINATION_CLINIC): Payer: Medicaid Other

## 2019-12-15 ENCOUNTER — Other Ambulatory Visit (HOSPITAL_COMMUNITY): Payer: Self-pay | Admitting: Student in an Organized Health Care Education/Training Program

## 2019-12-15 ENCOUNTER — Telehealth (HOSPITAL_COMMUNITY): Payer: Self-pay | Admitting: Student in an Organized Health Care Education/Training Program

## 2019-12-15 ENCOUNTER — Encounter (HOSPITAL_COMMUNITY): Payer: Medicaid Other | Admitting: Student in an Organized Health Care Education/Training Program

## 2019-12-15 NOTE — Telephone Encounter (Signed)
Patient no showed today's video appointment.  Called number on file at beginning of assigned appointment and phone rang for extended period of time with no answer.  Patient will need rescheduled.    Ferd Hibbs, MD

## 2019-12-24 ENCOUNTER — Encounter (HOSPITAL_BASED_OUTPATIENT_CLINIC_OR_DEPARTMENT_OTHER): Payer: Self-pay

## 2019-12-28 ENCOUNTER — Ambulatory Visit (VACCINATION_CLINIC): Payer: Medicaid Other

## 2020-01-21 ENCOUNTER — Encounter (HOSPITAL_BASED_OUTPATIENT_CLINIC_OR_DEPARTMENT_OTHER): Payer: Self-pay | Admitting: Family Medicine

## 2020-02-21 ENCOUNTER — Other Ambulatory Visit (HOSPITAL_COMMUNITY): Payer: Self-pay | Admitting: Student in an Organized Health Care Education/Training Program

## 2020-02-24 ENCOUNTER — Emergency Department (HOSPITAL_COMMUNITY)
Admission: EM | Admit: 2020-02-24 | Discharge: 2020-02-24 | Disposition: A | Discharge status: Home / Self Care | Payer: No Typology Code available for payment source | Attending: Emergency Medicine | Admitting: Emergency Medicine

## 2020-02-24 ENCOUNTER — Emergency Department (HOSPITAL_COMMUNITY): Payer: No Typology Code available for payment source

## 2020-02-24 ENCOUNTER — Encounter (HOSPITAL_COMMUNITY): Payer: Self-pay | Admitting: Emergency Medicine

## 2020-02-24 ENCOUNTER — Other Ambulatory Visit: Payer: Self-pay

## 2020-02-24 DIAGNOSIS — Y999 Unspecified external cause status: Secondary | ICD-10-CM | POA: Diagnosis not present

## 2020-02-24 DIAGNOSIS — M25512 Pain in left shoulder: Secondary | ICD-10-CM | POA: Diagnosis not present

## 2020-02-24 DIAGNOSIS — M545 Low back pain: Secondary | ICD-10-CM | POA: Insufficient documentation

## 2020-02-24 DIAGNOSIS — J45909 Unspecified asthma, uncomplicated: Secondary | ICD-10-CM | POA: Diagnosis not present

## 2020-02-24 DIAGNOSIS — Z041 Encounter for examination and observation following transport accident: Secondary | ICD-10-CM | POA: Diagnosis present

## 2020-02-24 DIAGNOSIS — Y9389 Activity, other specified: Secondary | ICD-10-CM | POA: Insufficient documentation

## 2020-02-24 DIAGNOSIS — M542 Cervicalgia: Secondary | ICD-10-CM | POA: Insufficient documentation

## 2020-02-24 DIAGNOSIS — Y92481 Parking lot as the place of occurrence of the external cause: Secondary | ICD-10-CM | POA: Diagnosis not present

## 2020-02-24 DIAGNOSIS — S161XXA Strain of muscle, fascia and tendon at neck level, initial encounter: Secondary | ICD-10-CM

## 2020-02-24 DIAGNOSIS — F17210 Nicotine dependence, cigarettes, uncomplicated: Secondary | ICD-10-CM | POA: Insufficient documentation

## 2020-02-24 DIAGNOSIS — M25511 Pain in right shoulder: Secondary | ICD-10-CM | POA: Insufficient documentation

## 2020-02-24 HISTORY — DX: Unspecified asthma, uncomplicated: J45.909

## 2020-02-24 MED ORDER — IBUPROFEN 800 MG PO TABS
800.0000 mg | ORAL_TABLET | Freq: Once | ORAL | Status: AC
  Administered 2020-02-24: 800 mg via ORAL
  Filled 2020-02-24: qty 1

## 2020-02-24 MED ORDER — IBUPROFEN 800 MG PO TABS: 800.0000 mg | ORAL_TABLET | Freq: Three times a day (TID) | ORAL | 0 refills | Status: AC

## 2020-02-24 MED ORDER — CYCLOBENZAPRINE HCL 5 MG PO TABS
5.0000 mg | ORAL_TABLET | Freq: Three times a day (TID) | ORAL | 0 refills | Status: AC | PRN
Start: 2020-02-24 — End: ?
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
## 2020-02-24 NOTE — Discharge Instructions (Signed)
Take motrin and flexeril for muscle spasms and pain   See your doctor   Expect to be stiff and sore tomorrow. Rest at home from work tomorrow   Return to ER if you have worse neck pain, back pain, headaches, vomiting.

## 2020-02-25 ENCOUNTER — Telehealth: Payer: Self-pay | Admitting: *Deleted

## 2020-02-25 NOTE — Telephone Encounter (Signed)
TOC CM received call from pt and states pharmacy did not receive meds. Contacted pharmacy and meds are ready. Texted goodrx coupons to pt. Flexeril $16 and Ibuprofen $13. Made pt aware. Isidoro Donning RN CCM, [facility] ED TOC CM [phone]

## 2020-03-09 ENCOUNTER — Other Ambulatory Visit: Payer: Self-pay

## 2020-03-09 ENCOUNTER — Ambulatory Visit (INDEPENDENT_AMBULATORY_CARE_PROVIDER_SITE_OTHER): Payer: Medicaid Other | Admitting: Professional

## 2020-03-09 DIAGNOSIS — F331 Major depressive disorder, recurrent, moderate: Secondary | ICD-10-CM

## 2020-03-09 DIAGNOSIS — F329 Major depressive disorder, single episode, unspecified: Secondary | ICD-10-CM

## 2020-03-09 NOTE — Progress Notes (Signed)
Frederick Schultz   [id number]  06/14/1981  03/09/2020    Chief Complaint   Patient presents with   . Psychotherapy        Time in:  1100              Time out: 1145    SUBJECTIVE:  Frederick Schultz presents to clinic today per established outpatient therapy treatment plan. Bleu discussed the events that have occurred since our last session including his discord with his roommate. He reports that his roommate wanted him to reconnect with therapy services as he has noticed a negative change. Client does not identify that therapy was helpful in the past and has difficulty creating treatment goals aside from pleasing his roommate. Next session scheduled for two months at which time he is to present with treatment goals in order to continue providing adequate therapy treatment.     OBJECTIVE:     Orientation: Fully oriented to person, place, time and situation.  Marland Kitchen     Appearance:  casually dressed.     Eye Contact:  good.     Behavior:  cooperative.     Attention:  good.     Speech:  normal rate and volume.     Motor:  no psychomotor retardation or agitation.     Mood:  anxious.     Affect:  appears anxious.     Thought Process:  goal oriented.     Thought Content:  no paranoia or delusions.     Suicidal Ideation:  none.     Homicidal Ideation:  none   Perception:  no hallucinations endorsed   Cognition:  concrete.     Insight:  fair.     Judgement:  fair.     Goals/wishes:  develop coping skills for anxiety.     Other objective findings:  none    ASSESSMENT:    MDD    PROCEDURE:  Used supportive dynamic therapy to encourage identification and expression of feeling states.  Reinforced patient's self-efficacy.  Encouraged increased understanding and acceptance of feelings.  Assisted patient in identifying unproductive defense mechanisms with more helpful adaptive coping strategies. Used Dialectical Behavior therapy to decrease emotion dysregulation, learn effective interpersonal relationship skills, and increase  crisis survival skills.     Inquired for suicidal ideation/homicidal ideation. Frederick Schultz does not endorse current SI or HI.  * Frederick Schultz has information and plan for safety and is willing to seek help if SI and/or HI  are present.    PLAN:    Return for outpatient individual therapy bi monthly as per therapy treatment plan.     Frederick Schultz, LPC, 03/09/2020, 12:06

## 2020-03-24 ENCOUNTER — Other Ambulatory Visit (HOSPITAL_COMMUNITY): Payer: Self-pay | Admitting: Student in an Organized Health Care Education/Training Program

## 2020-03-31 ENCOUNTER — Ambulatory Visit (INDEPENDENT_AMBULATORY_CARE_PROVIDER_SITE_OTHER): Payer: Medicaid Other | Admitting: Student in an Organized Health Care Education/Training Program

## 2020-03-31 ENCOUNTER — Other Ambulatory Visit: Payer: Self-pay

## 2020-03-31 VITALS — BP 122/78 | HR 87 | Wt 234.8 lb

## 2020-03-31 DIAGNOSIS — F331 Major depressive disorder, recurrent, moderate: Secondary | ICD-10-CM

## 2020-03-31 DIAGNOSIS — Z6837 Body mass index (BMI) 37.0-37.9, adult: Secondary | ICD-10-CM

## 2020-03-31 DIAGNOSIS — F419 Anxiety disorder, unspecified: Secondary | ICD-10-CM

## 2020-03-31 DIAGNOSIS — F332 Major depressive disorder, recurrent severe without psychotic features: Secondary | ICD-10-CM

## 2020-03-31 MED ORDER — PAROXETINE 10 MG TABLET
10.0000 mg | ORAL_TABLET | Freq: Every day | ORAL | 0 refills | Status: DC
Start: 2020-03-31 — End: 2020-04-28

## 2020-03-31 MED ORDER — HYDROXYZINE PAMOATE 50 MG CAPSULE
50.0000 mg | ORAL_CAPSULE | Freq: Every evening | ORAL | 2 refills | Status: DC | PRN
Start: 2020-03-31 — End: 2020-12-15

## 2020-03-31 MED ORDER — PAROXETINE 20 MG TABLET
20.0000 mg | ORAL_TABLET | Freq: Every day | ORAL | 2 refills | Status: DC
Start: 2020-03-31 — End: 2020-04-28

## 2020-03-31 MED ORDER — DULOXETINE 30 MG CAPSULE,DELAYED RELEASE
30.0000 mg | DELAYED_RELEASE_CAPSULE | Freq: Every day | ORAL | 0 refills | Status: DC
Start: 2020-03-31 — End: 2020-05-14

## 2020-03-31 NOTE — Progress Notes (Signed)
BEHAVIORAL [facility]ICINE, [place]  [address]  [facility] [place] [address]  In person     Name: Frederick Schultz  MRN: [id number]    Date: 03/31/2020  Age: 39 y.o.       The patient/family initiated a request for virtual check-in and is established with this practice.  Verbal consent for this service was obtained from the patient/family.     Chief complaint: Med check    Subjective:  Frederick Schultz is a 39 y.o. male presenting for medication management for MDD and anxiety.  Has roommate.  Periodic panic attacks. Anxious about applying for disability for anxiety.  Problems with going and staying asleep.  Variable sleep.  Sleep gets broken up.  Energy is so-so.  Patient irritated.  Reports anxiety down since getting COVID vaccines.  Enjoys streaming games.  Generally sleeps 2 hours at a time.  Struggles with feeling anxious and racing thoughts at night so goes on computer.  Endorses generalized anxiety.  Patient expressed feeling that lithium was the best med he has been on.  Having RLS type sx again.      Side-effects/ROS:   Denies dizziness/lightheadedness  Denies palpitations, chest pain  Denies nausea, vomiting, diarrhea    Collateral: EMR  Psych hx:  Attempted suicide at 81 and was hospitalized for 2 weeks for this.  Medication Trials: Has tried Prozac (made "hyper" per patient as a child), Lithium (felt effective but was taken off due to concerns for potential long term side effects - denies ever having side effects or elevated levels on this), Depakote, trazodone - took once (got priapism), Buspar ineffective, Wellbutrin - lack of efficacy; vistaril - perceived lack of efficacy; Zoloft - RLS-type sensations  Medical hx:  Back pain; HTN  Social:  Primarily stays at home.  Does live action role play.  Infrequent alcohol use with no alcohol in last 1-2 months.      Outpatient medications:    Current Outpatient Medications   Medication Sig    amLODIPine (NORVASC) 10 mg Oral Tablet Take 1 Tab (10 mg total) by mouth  Once a day    DULoxetine (CYMBALTA DR) 60 mg Oral Capsule, Delayed Release(E.C.) Take 1 capsule by mouth once daily    naproxen (NAPROSYN) 500 mg Oral Tablet Take 1 Tab (500 mg total) by mouth Every 12 hours as needed for Pain    propranoloL (INDERAL) 10 mg Oral Tablet Take 1 Tab (10 mg total) by mouth Three times a day    triamterene-hydroCHLOROthiazide (MAXZIDE) 75-50 mg Oral Tablet Take 1 Tab by mouth Once a day        Objective:  Physical Exam:   Blood pressure 122/78, pulse 87, weight 107 kg (234 lb 12.6 oz), SpO2 98 %.  General:  appears in good health, appears stated age and vital signs reviewed  Eyes:  Conjunctiva clear; no nystagmus; no obvious EOM abnormalities  HENT:  Head is normocephalic, atraumatic   Neck:  Midline  Heart:  No cyanosis or JVD  Lungs:  Nonlabored breathing.    Abdomen:  Nondistended.  Protuberant.  Extremities:  No clubbing or edema on exposed skin  Neurologic:  Nontremulous.  Alert and grossly oriented.    Mental Status Exam:  Appearance:  casually dressed and appears actual age  Behavior:  cooperative and eye contact  Fair  Motor/MS:  normal gait  Speech:  normal  Mood:  "about the same"  Affect:   anxious  Perception:  normal  Thought:  goal directed/coherent  Thought Content:  appropriate   Suicidal Ideation:  none.    Homicidal Ideation:  none  Level of Consciousness:  alert  Orientation:  grossly normal  Concentration:  good  Memory:  grossly normal  Conceptual:  abstract thinking  Judgement:  fair  Insight:  fair        Assessment:  Frederick Schultz is a 39 y.o. male with MDD and unspecified anxiety disorder (patient possesses some traits suggestive of obsessive-compulsive spectrum as well as the stress/trauma related spectrum however not sufficient information to make a DSM-5 based diagnoses at this time) who continues to struggle with both anxiety as well as depression.  Feels that his anxiety is fueling his depression.  While patient did not have a full trial of Wellbutrin (did  not go beyond 150mg  DAILY), given that patient identified that anxiety as the driving force, this was not resumed and Buspar (patient feels it is ineffective) was switched to propanolol to target anxiety as well as chronic feeling of restlessness.   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  Plan to transition to Paxil.  No acute safety concerns at this time.    Psychiatric Diagnoses: MDD, recurrent, severe, without psychotic features; and unspecified anxiety disorder (suspect panic disorder vs GAD with panic vs agoraphobia)    Plan:    Discussed risks, benefits, and alternatives. Patient agreeable to plan as described below.   Medications:  Orders Placed This Encounter    PARoxetine (PAXIL) 20 mg Oral Tablet    DULoxetine (CYMBALTA DR) 30 mg Oral Capsule, Delayed Release(E.C.)    PARoxetine (PAXIL) 10 mg Oral Tablet    hydrOXYzine pamoate (VISTARIL) 50 mg Oral Capsule     Crosstapering Cymbalta 60mg  QD to Paxil 20mg  QD  The risks and benefits and alternatives of Serotonin Selective Reuptake Inhibitors were discussed, including but not limited to: nausea and gastrointestinal difficulties, the risk of activation and manic shifts, other common side effects, and the black box warning on suicidality.  Potential for serotonin syndrome when used in combination with other serotonergic agents discussed.   Continue propranolol 10mg  TID for anxiety    Laboratory Studies:  Most recent labs reviewed, No new labs needed at this time     Therapy:  Follows with , [facility] for therapy, but has not had an appointment in several months.  Experience validated and supportive presence provided    Follow up:  - Follow up planned for Follow up 04/28/20  - Patient advised to call the call center or send mychart message with any questions or concerns.   - Patient advised to report to nearest emergency department or to call 911 if having any suicidal or  homicidal ideations.    , MD     Late entry for 03-31-2020    I saw and examined the patient.  I reviewed the resident's note.  I agree with the findings and plan of care as documented in the resident's note.  Any exceptions/additions are edited/noted.    [place], MD

## 2020-04-06 ENCOUNTER — Other Ambulatory Visit (HOSPITAL_COMMUNITY): Payer: Self-pay | Admitting: Student in an Organized Health Care Education/Training Program

## 2020-04-06 MED ORDER — PROPRANOLOL 10 MG TABLET
10.0000 mg | ORAL_TABLET | Freq: Three times a day (TID) | ORAL | 2 refills | Status: DC
Start: 2020-04-06 — End: 2020-06-03

## 2020-04-06 NOTE — Telephone Encounter (Addendum)
Called patient.  Clarified med changes.    ----- Message from Frederick Schultz sent at 04/05/2020 11:53 AM EDT -----  Pt requesting a return call. Stating he thought he was to be doing off the propanolol. Asking for clarification. Please advise. Thank you!

## 2020-04-28 ENCOUNTER — Encounter (HOSPITAL_COMMUNITY): Payer: Self-pay | Admitting: Student in an Organized Health Care Education/Training Program

## 2020-04-28 ENCOUNTER — Ambulatory Visit (INDEPENDENT_AMBULATORY_CARE_PROVIDER_SITE_OTHER): Payer: Medicaid Other | Admitting: Student in an Organized Health Care Education/Training Program

## 2020-04-28 ENCOUNTER — Other Ambulatory Visit: Payer: Self-pay

## 2020-04-28 VITALS — BP 128/80 | HR 97 | Ht 66.0 in | Wt 237.9 lb

## 2020-04-28 DIAGNOSIS — F419 Anxiety disorder, unspecified: Secondary | ICD-10-CM

## 2020-04-28 DIAGNOSIS — Z6838 Body mass index (BMI) 38.0-38.9, adult: Secondary | ICD-10-CM

## 2020-04-28 DIAGNOSIS — F331 Major depressive disorder, recurrent, moderate: Secondary | ICD-10-CM

## 2020-04-28 MED ORDER — PAROXETINE 30 MG TABLET
30.0000 mg | ORAL_TABLET | Freq: Every day | ORAL | 2 refills | Status: DC
Start: 2020-04-28 — End: 2020-06-03

## 2020-04-28 NOTE — Progress Notes (Signed)
BEHAVIORAL MEDICINE, [place]  [address]  [place] [address]    IN PERSON VISIT        Name: Frederick Schultz  MRN: [id number]    Date: 04/28/2020  Age: 39 y.o.       The patient/family initiated a request for virtual check-in and is established with this practice.  Verbal consent for this service was obtained from the patient/family.     Chief complaint: Med check    Subjective:  Frederick Schultz is a 39 y.o. male presenting for medication management for MDD and anxiety.  Dog passed away about a week ago so having acute stressor. Currently taking propranolol 10 mg BID.  Has been on the Paxil 20 mg for the last week.   Some restless leg sensations.  Has been having abdominal pain which is ongoing and not new that he has been taking Tums for.  Energy is baseline which he reports for him is low.  Reports he is trying to apply for disability for his anxiety.   No SI, HI, or AVH.    Side-effects/ROS:   Denies dizziness/lightheadedness  Denies changes in appetite  Denies nausea, vomiting, diarrhea    Collateral: EMR  Psych hx:  Attempted suicide at 67 and was hospitalized for 2 weeks for this.  Medication Trials: Has tried Prozac (made "hyper" per patient as a child), Lithium (felt effective but was taken off due to concerns for potential long term side effects - denies ever having side effects or elevated levels on this), Depakote, trazodone - took once (got priapism), Buspar ineffective, Wellbutrin - lack of efficacy; vistaril - perceived lack of efficacy; Zoloft 200 mg DAILY - RLS-type sensations; Cymbalta 60 mg DAILY (continued anxiety despite optimizing dose and had return of RLS type phenomena); Lexapro 20 mg DAILY (mood and anxiety remained suboptimally controlled)  Medical hx:  Back pain; HTN  Social:  Primarily stays at home.  Does live action role play.  Infrequent alcohol use with no alcohol in last 1-2 months.      Outpatient medications:    Current Outpatient Medications   Medication Sig     amLODIPine (NORVASC) 10 mg Oral Tablet Take 1 Tab (10 mg total) by mouth Once a day    DULoxetine (CYMBALTA DR) 30 mg Oral Capsule, Delayed Release(E.C.) Take 1 Capsule (30 mg total) by mouth Once a day for 7 days    hydrOXYzine pamoate (VISTARIL) 50 mg Oral Capsule Take 1 Capsule (50 mg total) by mouth Every night as needed for Other (insomnia)    naproxen (NAPROSYN) 500 mg Oral Tablet Take 1 Tab (500 mg total) by mouth Every 12 hours as needed for Pain    PARoxetine (PAXIL) 10 mg Oral Tablet Take 1 Tablet (10 mg total) by mouth Once a day for 7 days    PARoxetine (PAXIL) 20 mg Oral Tablet Take 1 Tablet (20 mg total) by mouth Once a day Take after finishing 10mg  starter script.    propranoloL (INDERAL) 10 mg Oral Tablet Take 1 Tablet (10 mg total) by mouth Three times a day    triamterene-hydroCHLOROthiazide (MAXZIDE) 75-50 mg Oral Tablet Take 1 Tab by mouth Once a day        Objective:  Physical Exam:   Blood pressure 128/80, pulse 97, height 1.676 m (5\' 6" ), weight 108 kg (237 lb 14 oz), SpO2 98 %.  General:  appears in good health, appears stated age and vital signs reviewed  Eyes:  Conjunctiva clear; no nystagmus; no obvious EOM abnormalities  HENT:  Head is normocephalic, atraumatic   Neck:  Midline  Heart:  No cyanosis or JVD  Lungs:  Nonlabored breathing.    Abdomen:  Nondistended.  Protuberant.  Extremities:  No clubbing or edema on exposed skin  Neurologic:  Nontremulous.  Alert and grossly oriented.    Mental Status Exam:  Appearance:  casually dressed and appears actual age; wearing grey beanie cap  Behavior:  cooperative and eye contact  Fair  Motor/MS:  normal gait; bounces leg   Speech:  normal  Mood:  "I haven't noticed a difference"  Affect:   anxious  Perception:  normal  Thought:  goal directed/coherent  Thought Content:  appropriate   Suicidal Ideation:  none.    Homicidal Ideation:  none  Level of Consciousness:  alert  Orientation:  grossly normal  Concentration:  good  Memory:  grossly  normal  Conceptual:  abstract thinking  Judgement:  Fair to good  Insight:  Fair to good    Assessment:  Frederick Schultz is a 39 y.o. male with MDD and unspecified anxiety disorder (patient possesses some traits suggestive of obsessive-compulsive spectrum as well as the stress/trauma related spectrum however not sufficient information to make a DSM-5 based diagnoses at this time) who continues to struggle with both anxiety as well as depression.  Feels that his anxiety is fueling his depression.  While patient did not have a full trial of Wellbutrin (did not go beyond 150mg  DAILY), given that patient identified that anxiety as the driving force, this was not resumed and Buspar (patient feels it is ineffective) was switched to propanolol to target anxiety as well as chronic feeling of restlessness.   Patient off Cymbalta and has been on Paxil 20 mg DAILY for a week.  Continued anxiety and low mood.  Has acute stressor of dog dying.  Ancipitate patient will ultimately need at least 30 mg DAILY dose for Paxil.  Discussed dose increase now vs giving more time to assess response at this time for which there was a mutual decision to increase dose to 30 mg DAILY.  Patient provided form from lawyer that he desired be filled out by this provider, however discussed with patient and confirmed with forensic faculty that this would not be something this provider could fill out and advised patient go through medical records if desiring documentation regarding his condition and treatment.  Psychoeducation provided regarding overall goal of undersigned being to optimize functioning and that the literature shows better outcomes for those who return to work.  Plan to continue outpatient med management.  Patient encouraged to resume treatment with his therapist.  No acute safety concerns at this time.    Psychiatric Diagnoses: MDD, recurrent, severe, without psychotic features; and unspecified anxiety disorder (suspect panic disorder vs  GAD with panic vs agoraphobia)    Plan:    Discussed risks, benefits, and alternatives. Patient agreeable to plan as described below.   Medications:  Orders Placed This Encounter    PARoxetine (PAXIL) 30 mg Oral Tablet     Sig: Take 1 Tablet (30 mg total) by mouth Once a day     Dispense:  30 Tablet     Refill:  2     Dose increase 20mg  daily to 30mg  daily       Increase Paxil 20 mg DAILY to 30 mg DAILY  The risks and benefits and alternatives of Serotonin Selective Reuptake Inhibitors were discussed, including  but not limited to: nausea and gastrointestinal difficulties, the risk of activation and manic shifts, other common side effects, and the black box warning on suicidality.  Potential for serotonin syndrome when used in combination with other serotonergic agents discussed.   Continue propranolol 10 mg BID-TID for anxiety (reports has been doing mostly as BID due to forgetting 3rd dose).    Laboratory Studies:  Most recent labs reviewed, No new labs needed at this time     Therapy:  Follows with Levonne Hubert, [facility] for therapy, but has not had an appointment in several months. - has an appointment next month.  Experience validated and supportive presence provided    Follow up:  -  Follow up in 1 month in person.  - Patient advised to call the call center or send MyChart message with any questions or concerns.       Ferd Hibbs, MD   04/28/2020      I saw and examined the patient in person at [facility] from 15:35 to 15:40 for a total of 5 minutes, along with the resident.  I was present and participated in the development of the treatment plan. I reviewed the resident's note. I agree with the findings and plan of care, as documented in the resident's note.  Any exceptions/ additions are edited/noted.    Brenon Antosh N. Christena Deem, M.D.   Staff Psychiatrist  Fue Cervenka Marcille Buffy, MD  04/28/2020, 17:13

## 2020-05-02 ENCOUNTER — Encounter (HOSPITAL_BASED_OUTPATIENT_CLINIC_OR_DEPARTMENT_OTHER): Payer: Self-pay | Admitting: Family Medicine

## 2020-05-02 ENCOUNTER — Ambulatory Visit: Payer: Medicaid Other | Attending: Family Medicine | Admitting: Family Medicine

## 2020-05-02 ENCOUNTER — Other Ambulatory Visit: Payer: Self-pay

## 2020-05-02 VITALS — BP 98/76 | HR 100 | Temp 98.4°F | Resp 20 | Ht 66.0 in | Wt 236.6 lb

## 2020-05-02 DIAGNOSIS — F39 Unspecified mood [affective] disorder: Secondary | ICD-10-CM | POA: Insufficient documentation

## 2020-05-02 DIAGNOSIS — F329 Major depressive disorder, single episode, unspecified: Secondary | ICD-10-CM

## 2020-05-02 DIAGNOSIS — R1011 Right upper quadrant pain: Secondary | ICD-10-CM | POA: Insufficient documentation

## 2020-05-02 DIAGNOSIS — Z6838 Body mass index (BMI) 38.0-38.9, adult: Secondary | ICD-10-CM

## 2020-05-02 DIAGNOSIS — I10 Essential (primary) hypertension: Secondary | ICD-10-CM

## 2020-05-02 DIAGNOSIS — Z79899 Other long term (current) drug therapy: Secondary | ICD-10-CM

## 2020-05-02 DIAGNOSIS — F411 Generalized anxiety disorder: Secondary | ICD-10-CM

## 2020-05-02 NOTE — Progress Notes (Signed)
Family Medicine, [facility]  [address]  [place] [address]  [phone]     Clinical Progress Note        Frederick Schultz  Date of Service: 05/02/2020    Chief complaint:   Chief Complaint   Patient presents with    Follow-up    Blood Pressure Check       Subjective:    39 y.o.male with a past medical history of HTN amd mood disorder (GAD and MDD) presents in follow up for chronic disease management.     In terms of his hypertension, he is acutely worried about his BP. It is lower than it was 4 days ago when he was at [facility]. He is taking amlodipine 10mg  daily and triamterene-HCTZ 75-50mg  daily. No complaints of lightheadedness, dizziness, HA, change in vision, CP, or palpitations.    In terms of his mood, his mood is pretty stable. Follows with [facility]. Taking paroxetine 30mg  daily, propranolol 10mg  TID, and hydroxyzine 50mg  QHS prn.    Over the past few weeks, he has epigastric abdominal pain. He gets pains on the right side and then the pain radiates to the center and into his chest. He has a crampy sensation along with a burning as well. Feels hungry all the time. Worse lying down. More noticeable when he is trying to sleep. TUMS and milk do seem to make it better periodically. No black, sticky, tar like stool. More formed lately. He no longer drinks alcohol, does not smoke anymore, and cut back on his caffeine intake a lot. Rarely uses NSAIDs. Interestingly, he feels well today.       Medications:  Outpatient Medications Marked as Taking for the 05/02/20 encounter (Office Visit) with , DO   Medication Sig    amLODIPine (NORVASC) 10 mg Oral Tablet Take 1 Tab (10 mg total) by mouth Once a day    hydrOXYzine pamoate (VISTARIL) 50 mg Oral Capsule Take 1 Capsule (50 mg total) by mouth Every night as needed for Other (insomnia)    PARoxetine (PAXIL) 30 mg Oral Tablet Take 1 Tablet (30 mg total) by mouth Once a day    propranoloL (INDERAL) 10 mg Oral Tablet  Take 1 Tablet (10 mg total) by mouth Three times a day    triamterene-hydroCHLOROthiazide (MAXZIDE) 75-50 mg Oral Tablet Take 1 Tab by mouth Once a day          Allergies:  Allergies   Allergen Reactions    Asa Buff (Mag Carb-Al Glyc) [Aspirin, Buffered]        Medical History:  Past Medical History:   Diagnosis Date    Anxiety     Asthma     Bipolar 1 disorder (CMS HCC)     Depression        Objective:  Vitals: BP 98/76 (Site: Left, Patient Position: Sitting, Cuff Size: Adult Large)    Pulse 100    Temp 36.9 C (98.4 F) (Thermal Scan)    Resp 20    Ht 1.676 m (5\' 6" )    Wt 107 kg (236 lb 8.9 oz)    SpO2 97%    BMI 38.18 kg/m     General: Well developed, well nourished. No acute distress  HEENT: Normocephalic, atraumatic. Extra occular movements intact bilaterally  Heart: Regular rate and rhythm, no appreciable murmurs, rubs, or gallops  Lungs: Clear to ascultation bilaterally, no appreciable wheezes, rales, or rhonchi  Abdomen: Bowel Sounds present in all 4  quadrants, soft, non-tender, non-distended, no appreciable rebound, guarding, or rigidity  Extremities: No appreciable clubbing, cyanosis, or edema  Psych: Awake, alert, pleasant, mood euthymic      Assessment/Plan:  1. RUQ abdominal pain   -- Acute. I suspect this is PUD vs GERD.   -- Update H pylori breath test, CBC, and CMP  -- Once labs are available, will initiate appropriate treatment (Quad therapy vs PPI alone)     2. Essential hypertension   -- Slightly low today. He would not like to adjust meds at this time, feeling it is more of an anamoly  -- Cont amlodipine 10mg  daily   -- Cont triamterene 75mg -50mg  daily      3. Mood disorder (CMS HCC)   -- Well controlled. Followed by psychiatry  -- Cont paroxetine 30mg  daily (recently increased by psych Aug 2021)  -- Cont propranolol 10mg  TID  -- Cont hydroxyzine 50mg  QHS prn       Return in about 6 months (around 11/02/2020) for recheck HTN and GERD.      Cohan Stipes B. , DO

## 2020-05-03 ENCOUNTER — Encounter (HOSPITAL_BASED_OUTPATIENT_CLINIC_OR_DEPARTMENT_OTHER): Payer: Self-pay | Admitting: Family Medicine

## 2020-05-09 ENCOUNTER — Other Ambulatory Visit: Payer: Self-pay

## 2020-05-09 ENCOUNTER — Ambulatory Visit: Payer: Medicaid Other | Attending: Family Medicine

## 2020-05-09 DIAGNOSIS — R1011 Right upper quadrant pain: Secondary | ICD-10-CM | POA: Insufficient documentation

## 2020-05-09 DIAGNOSIS — I10 Essential (primary) hypertension: Secondary | ICD-10-CM | POA: Insufficient documentation

## 2020-05-09 LAB — CBC WITH DIFF
BASOPHIL #: <0.1 10*3/uL (ref <=0.20)
BASOPHIL %: 1 %
EOSINOPHIL #: 0.32 10*3/uL (ref <=0.50)
EOSINOPHIL %: 3 %
HCT: 42 % (ref 38.9–52.0)
HGB: 14.6 g/dL (ref 13.4–17.5)
IMMATURE GRANULOCYTE #: <0.1 10*3/uL (ref <0.10)
IMMATURE GRANULOCYTE %: 1 % (ref 0–1)
LYMPHOCYTE #: 2.76 10*3/uL (ref 1.00–4.80)
LYMPHOCYTE %: 28 %
MCH: 29.3 pg (ref 26.0–32.0)
MCHC: 34.8 g/dL (ref 31.0–35.5)
MCV: 84.2 fL (ref 78.0–100.0)
MONOCYTE #: 0.52 10*3/uL (ref 0.20–1.10)
MONOCYTE %: 5 %
MPV: 11.6 fL (ref 8.7–12.5)
NEUTROPHIL #: 6.03 10*3/uL (ref 1.50–7.70)
NEUTROPHIL %: 62 %
PLATELETS: 281 10*3/uL (ref 150–400)
RBC: 4.99 10*6/uL (ref 4.50–6.10)
RDW-CV: 12.6 % (ref 11.5–15.5)
WBC: 9.8 10*3/uL (ref 3.7–11.0)

## 2020-05-09 LAB — BASIC METABOLIC PANEL
ANION GAP: 9 mmol/L (ref 4–13)
BUN/CREA RATIO: 16 (ref 6–22)
BUN: 24 mg/dL (ref 8–25)
CALCIUM: 9.1 mg/dL (ref 8.5–10.0)
CHLORIDE: 102 mmol/L (ref 96–111)
CO2 TOTAL: 28 mmol/L (ref 22–30)
CREATININE: 1.54 mg/dL — ABNORMAL HIGH (ref 0.75–1.35)
ESTIMATED GFR: 56 mL/min/BSA — ABNORMAL LOW (ref >=60)
GLUCOSE: 96 mg/dL (ref 65–125)
POTASSIUM: 3.2 mmol/L — ABNORMAL LOW (ref 3.5–5.1)
SODIUM: 139 mmol/L (ref 136–145)

## 2020-05-09 LAB — LIPID PANEL
CHOL/HDL RATIO: 4.5
CHOLESTEROL: 130 mg/dL (ref 100–200)
HDL CHOL: 29 mg/dL — ABNORMAL LOW (ref >=50)
LDL CALC: 62 mg/dL (ref <100)
NON-HDL: 101 mg/dL (ref <=190)
TRIGLYCERIDES: 197 mg/dL — ABNORMAL HIGH (ref <150)
VLDL CALC: 39 mg/dL — ABNORMAL HIGH (ref <30)

## 2020-05-10 ENCOUNTER — Ambulatory Visit (INDEPENDENT_AMBULATORY_CARE_PROVIDER_SITE_OTHER): Payer: Medicaid Other | Admitting: Professional

## 2020-05-10 DIAGNOSIS — F331 Major depressive disorder, recurrent, moderate: Secondary | ICD-10-CM

## 2020-05-11 NOTE — Progress Notes (Signed)
Frederick Schultz   [id number]  [birth year]  05/10/2020    Chief Complaint   Patient presents with   . Psychotherapy        Time in:  1402              Time out: 1459    SUBJECTIVE:  Frederick Schultz presents to clinic today per established outpatient therapy treatment plan. Hatem discussed the events that have occurred since our last session including the death of his dog. We discussed his anger and tendency to revert to feelings of anger due to comfort. We discussed the PLEASED skill and the basic levels of self care.     OBJECTIVE:     Orientation: Fully oriented to person, place, time and situation.  Marland Kitchen     Appearance:  casually dressed.     Eye Contact:  good.     Behavior:  cooperative.     Attention:  good.     Speech:  normal rate and volume.     Motor:  no psychomotor retardation or agitation.     Mood:  anxious.     Affect:  appears anxious.     Thought Process:  goal oriented.     Thought Content:  no paranoia or delusions.     Suicidal Ideation:  none.     Homicidal Ideation:  none   Perception:  no hallucinations endorsed   Cognition:  concrete.     Insight:  fair.     Judgement:  fair.     Goals/wishes:  develop coping skills for anxiety.     Other objective findings:  none    ASSESSMENT:    MDD    PROCEDURE:  Used supportive dynamic therapy to encourage identification and expression of feeling states.  Reinforced patient's self-efficacy.  Encouraged increased understanding and acceptance of feelings.  Assisted patient in identifying unproductive defense mechanisms with more helpful adaptive coping strategies. Used Dialectical Behavior therapy to decrease emotion dysregulation, learn effective interpersonal relationship skills, and increase crisis survival skills.     Inquired for suicidal ideation/homicidal ideation. Frederick Schultz does not endorse current SI or HI.  * Frederick Schultz has information and plan for safety and is willing to seek help if SI and/or HI  are present.    PLAN:    Return for  outpatient individual therapy bi monthly as per therapy treatment plan.     Frederick Schultz, LPC, 05/11/2020, 11:58

## 2020-05-13 LAB — HELICOBACTER PYLORI BREATH TEST: H. PYLORI,UREA BREATH TEST: NOT DETECTED

## 2020-05-14 ENCOUNTER — Encounter (HOSPITAL_BASED_OUTPATIENT_CLINIC_OR_DEPARTMENT_OTHER): Payer: Self-pay | Admitting: Student in an Organized Health Care Education/Training Program

## 2020-05-14 ENCOUNTER — Other Ambulatory Visit: Payer: Self-pay

## 2020-05-14 ENCOUNTER — Ambulatory Visit
Payer: Medicaid Other | Attending: Family Medicine | Admitting: Student in an Organized Health Care Education/Training Program

## 2020-05-14 VITALS — BP 128/80 | HR 91 | Temp 98.6°F | Resp 20 | Ht 65.98 in | Wt 232.8 lb

## 2020-05-14 DIAGNOSIS — Z6837 Body mass index (BMI) 37.0-37.9, adult: Secondary | ICD-10-CM

## 2020-05-14 DIAGNOSIS — K279 Peptic ulcer, site unspecified, unspecified as acute or chronic, without hemorrhage or perforation: Secondary | ICD-10-CM | POA: Insufficient documentation

## 2020-05-14 DIAGNOSIS — M549 Dorsalgia, unspecified: Secondary | ICD-10-CM | POA: Insufficient documentation

## 2020-05-14 DIAGNOSIS — Z79899 Other long term (current) drug therapy: Secondary | ICD-10-CM

## 2020-05-14 DIAGNOSIS — I10 Essential (primary) hypertension: Secondary | ICD-10-CM | POA: Insufficient documentation

## 2020-05-14 DIAGNOSIS — R109 Unspecified abdominal pain: Secondary | ICD-10-CM | POA: Insufficient documentation

## 2020-05-14 MED ORDER — DICLOFENAC 1 % TOPICAL GEL
Freq: Four times a day (QID) | CUTANEOUS | 1 refills | Status: DC
Start: 2020-05-14 — End: 2022-02-12

## 2020-05-14 MED ORDER — PANTOPRAZOLE 40 MG TABLET,DELAYED RELEASE
40.0000 mg | DELAYED_RELEASE_TABLET | Freq: Every day | ORAL | 0 refills | Status: DC
Start: 2020-05-14 — End: 2020-07-07

## 2020-05-14 NOTE — Progress Notes (Signed)
Department of Family Medicine   Progress Note    Frederick Schultz  MRN: [id number]  DOB: 07/12/1981  Date of Service: 05/14/2020    CHIEF COMPLAINT  Chief Complaint   Patient presents with    Abdominal Pain       SUBJECTIVE  Frederick Schultz is a 39 y.o. male who presents to clinic for follow up of abdominal pain.     Patient was seen on 05/02/20 and discussed epigastric abdominal pain with his PCP. He continues to note mostly epigastric and RUQ pain. It occasionally radiates around his back and up to his right chest. It is dull and almost constant. He does note it seems to improve after a meal.  H. Pylori was negative. CBC WNL. Denies postprandial nausea/vomiting. He does state he has been taking naproxen daily. Denies and blood in stool. States he has mild constipation, e.g. does not have a bowel movement daily.     HTN: Takes Norvasc 10 mg daily and maxzide 75-50 mg daily. BP at goal today. Denies SOB, dizziness.    Back pain: endorses mild back pain and stiffness. Takes naproxen daily for this.     Review of Systems:  As per HPI, otherwise all systems negative    Medications:   amLODIPine (NORVASC) 10 mg Oral Tablet, Take 1 Tab (10 mg total) by mouth Once a day  hydrOXYzine pamoate (VISTARIL) 50 mg Oral Capsule, Take 1 Capsule (50 mg total) by mouth Every night as needed for Other (insomnia)  naproxen (NAPROSYN) 500 mg Oral Tablet, Take 1 Tab (500 mg total) by mouth Every 12 hours as needed for Pain  PARoxetine (PAXIL) 30 mg Oral Tablet, Take 1 Tablet (30 mg total) by mouth Once a day  propranoloL (INDERAL) 10 mg Oral Tablet, Take 1 Tablet (10 mg total) by mouth Three times a day  triamterene-hydroCHLOROthiazide (MAXZIDE) 75-50 mg Oral Tablet, Take 1 Tab by mouth Once a day  DULoxetine (CYMBALTA DR) 30 mg Oral Capsule, Delayed Release(E.C.), Take 1 Capsule (30 mg total) by mouth Once a day for 7 days    No facility-administered medications prior to visit.      Allergies:   Allergies   Allergen Reactions    Asa Buff (Mag  Carb-Al Glyc) [Aspirin, Buffered]        Social History:  Social History     Socioeconomic History    Marital status: Single     Spouse name: Not on file    Number of children: Not on file    Years of education: Not on file    Highest education level: Not on file   Occupational History    Occupation: [employer]   Tobacco Use    Smoking status: Former Smoker    Smokeless tobacco: Never Used   Haematologist Use: Never used   Substance and Sexual Activity    Alcohol use: Yes     Alcohol/week: 0.0 standard drinks     Comment: rare    Drug use: No    Sexual activity: Not on file   Other Topics Concern    Abuse/Domestic Violence Not Asked    Caffeine Concern Not Asked    Calcium intake adequate Not Asked    Computer Use Not Asked    Drives Not Asked    Exercise Concern Not Asked    Helmet Use Not Asked    Seat Belt Not Asked    Special Diet Not Asked    Sunscreen  used Not Asked    Uses Cane Not Asked    Uses walker Not Asked    Uses wheelchair Not Asked    Right hand dominant Not Asked    Left hand dominant Not Asked    Ambidextrous Not Asked    Shift Work Not Asked    Unusual Sleep-Wake Schedule Not Asked   Social History Narrative    Not on file       OBJECTIVE  BP 128/80 (Cuff Size: Adult Large)   Pulse 91   Temp 37 C (98.6 F) (Thermal Scan)   Resp 20   Ht 1.676 m (5' 5.98")   Wt 106 kg (232 lb 12.9 oz)   SpO2 97%   BMI 37.59 kg/m       General: no distress  Lungs: clear to auscultation bilaterally  Cardiovascular: RRR, no murmur  Abdomen: soft, non tender, bowel sounds present  Extremities: no cyanosis or edema  Skin: warm and dry, no rash  Neurologic: gait is normal, AOx3, CN 2-12 grossly intact  Psychiatric: normal affect and behavior    ASSESSMENT/PLAN  1. Peptic ulcer disease (Primary)  2. Abdominal pain, unspecified abdominal location  Symptoms most concerning for PUD (duodenal ulcer) vs gastritis vs biliary colic  H pylori negative  Will start PPI x 4-8 weeks and follow up  Discussed  discontinuation of naproxen  Consider RUQ ultrasound in future if symptoms unimproved or become more consistent with biliary colic    3. Back pain, unspecified back location, unspecified back pain laterality, unspecified chronicity  Discontinue naproxen  Start voltaren gel, core strengthening exercises    4. HTN  Continue current regimen    Other orders  -     pantoprazole (PROTONIX) 40 mg Oral Tablet, Delayed Release (E.C.); Take 1 Tablet (40 mg total) by mouth Once a day  -     diclofenac sodium (VOLTAREN) 1 % Gel; by Apply Topically route Four times a day - before meals and bedtime          Orders Placed This Encounter    pantoprazole (PROTONIX) 40 mg Oral Tablet, Delayed Release (E.C.)    diclofenac sodium (VOLTAREN) 1 % Gel         Return in about 6 weeks (around 06/25/2020).      Noel Gerold, MD 05/14/2020, 11:04      I interviewed and examined the patient and discussed the plan of care with the resident and the patient at the same time. I agree with the findings and plan of care as documented in the resident's note.  Any exceptions/additions are edited/noted.    Aldean Ast, MD

## 2020-05-25 ENCOUNTER — Encounter (HOSPITAL_BASED_OUTPATIENT_CLINIC_OR_DEPARTMENT_OTHER): Payer: Self-pay

## 2020-06-02 ENCOUNTER — Encounter (HOSPITAL_COMMUNITY): Payer: Self-pay | Admitting: Student in an Organized Health Care Education/Training Program

## 2020-06-03 ENCOUNTER — Other Ambulatory Visit: Payer: Self-pay

## 2020-06-03 ENCOUNTER — Ambulatory Visit (INDEPENDENT_AMBULATORY_CARE_PROVIDER_SITE_OTHER): Payer: Medicaid Other | Admitting: Student in an Organized Health Care Education/Training Program

## 2020-06-03 VITALS — BP 128/80 | HR 78 | Wt 227.5 lb

## 2020-06-03 DIAGNOSIS — F419 Anxiety disorder, unspecified: Secondary | ICD-10-CM

## 2020-06-03 DIAGNOSIS — F331 Major depressive disorder, recurrent, moderate: Secondary | ICD-10-CM

## 2020-06-03 DIAGNOSIS — Z6836 Body mass index (BMI) 36.0-36.9, adult: Secondary | ICD-10-CM

## 2020-06-03 DIAGNOSIS — F411 Generalized anxiety disorder: Secondary | ICD-10-CM

## 2020-06-03 MED ORDER — PROPRANOLOL 10 MG TABLET
10.0000 mg | ORAL_TABLET | Freq: Two times a day (BID) | ORAL | 2 refills | Status: DC
Start: 2020-06-03 — End: 2020-12-15

## 2020-06-03 MED ORDER — PAROXETINE 40 MG TABLET
40.0000 mg | ORAL_TABLET | Freq: Every day | ORAL | 2 refills | Status: DC
Start: 2020-06-03 — End: 2020-10-04

## 2020-06-03 NOTE — Progress Notes (Signed)
[facility] [place]  [address]  [facility] [place] [address]    IN PERSON VISIT      Name: Frederick Schultz  MRN: [id number]    Date: 06/03/2020  Age: 39 y.o.       Chief complaint: Med check    Subjective:  Frederick Schultz is a 39 y.o. male presenting for medication management for MDD and anxiety.      Had increase in Paxil 30mg  QD and doing propranolol 10mg  BID.  Started pantoprazole and feels that this has been helpful.  Had been experiencing discomfort before this and is doing well with this.  Has been on this for a few weeks.  No side effects.  Usually takes in the afternoon.  Usually gets up between 10am and 2pm then takes his med.  Mood has been "pretty level" and not "happy" but "not any worse than baseline".  On 1-10 point scale reports being a 6 for anxiety (10 best).   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  No SI, HI, or AVH.    Side-effects/ROS:   Denies dizziness/lightheadedness  Denies changes in appetite  Denies nausea, vomiting, diarrhea    Collateral: EMR  Psych hx:  Attempted suicide at 61 and was hospitalized for 2 weeks for this.  Medication Trials: Has tried Prozac (made "hyper" per patient as a child), Lithium (felt effective but was taken off due to concerns for potential long term side effects - denies ever having side effects or elevated levels on this), Depakote, trazodone - took once (got priapism), Buspar ineffective, Wellbutrin - lack of efficacy; vistaril - perceived lack of efficacy; Zoloft 200 mg DAILY - RLS-type sensations; Cymbalta 60 mg DAILY (continued anxiety despite optimizing dose and had return of RLS type phenomena); Lexapro 20 mg DAILY (mood and anxiety remained suboptimally controlled)  Medical hx:  Back pain; HTN  Social:  Primarily stays at home.  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 Infrequent alcohol use with no alcohol in last 1-2 months.      Outpatient medications:    Current Outpatient  Medications   Medication Sig    amLODIPine (NORVASC) 10 mg Oral Tablet Take 1 Tab (10 mg total) by mouth Once a day    diclofenac sodium (VOLTAREN) 1 % Gel by Apply Topically route Four times a day - before meals and bedtime    hydrOXYzine pamoate (VISTARIL) 50 mg Oral Capsule Take 1 Capsule (50 mg total) by mouth Every night as needed for Other (insomnia)    naproxen (NAPROSYN) 500 mg Oral Tablet Take 1 Tab (500 mg total) by mouth Every 12 hours as needed for Pain    pantoprazole (PROTONIX) 40 mg Oral Tablet, Delayed Release (E.C.) Take 1 Tablet (40 mg total) by mouth Once a day    PARoxetine (PAXIL) 30 mg Oral Tablet Take 1 Tablet (30 mg total) by mouth Once a day    propranoloL (INDERAL) 10 mg Oral Tablet Take 1 Tablet (10 mg total) by mouth Three times a day    triamterene-hydroCHLOROthiazide (MAXZIDE) 75-50 mg Oral Tablet Take 1 Tab by mouth Once a day        Objective:  Physical Exam:   Blood pressure 128/80, pulse 78, weight 103 kg (227 lb 8.2 oz), SpO2 98 %.  General:  appears in good health, appears stated age and vital signs reviewed  Eyes:  Conjunctiva clear; no nystagmus; no obvious  EOM abnormalities  HENT:  Head is normocephalic, atraumatic   Neck:  Midline  Heart:  No cyanosis or JVD  Lungs:  Nonlabored breathing.    Abdomen:  Nondistended.  Protuberant.  Extremities:  No clubbing or edema on exposed skin  Neurologic:  Nontremulous.  Alert and grossly oriented.    Mental Status Exam:  Appearance:  casually dressed and appears actual age; wearing grey beanie cap and red shoes  Behavior:  cooperative and eye contact  Fair to good  Motor/MS:  normal gait; bounces leg   Speech:  normal  Mood:  "about the same"  Affect:   anxious  Perception:  normal  Thought:  goal directed/coherent  Thought Content:  appropriate   Suicidal Ideation:  none.    Homicidal Ideation:  none  Level of Consciousness:  alert  Orientation:  grossly normal  Concentration:  good  Memory:  grossly normal  Conceptual:  abstract  thinking  Judgement:  Fair to good  Insight:  Fair to good    Assessment:  Frederick Schultz is a 39 y.o. male with MDD and GAD presenting for med check.  Tolerating Paxil 30mg  QD but continuing to have anxiety and mood not at goal.  Expresses having existential thoughts about a life span being a finite period of time and struggling with identifying how best to use it.  Patient inquires about disability application process.  Psychoeducation provided regarding overall goal of undersigned being to optimize functioning and that the literature shows better outcomes for those who return to work.  Discussed that patient is welcome to request records and provide these to his attorney if he so chooses but that a treating provider would not be able to answer the types of questions his lawyer had asked about.  Plan to continue outpatient med management.  No acute safety concerns at this time.    Psychiatric Diagnoses: MDD, recurrent, moderate, without psychotic features; and GAD    Plan:    Discussed risks, benefits, and alternatives. Patient agreeable to plan as described below.   Medications:  No orders of the defined types were placed in this encounter.      Increase Paxil 30 mg DAILY to 40 mg DAILY - reports taking upon waking - not having problems with feeling tired from this  The risks and benefits and alternatives of Serotonin Selective Reuptake Inhibitors were discussed, including but not limited to: nausea and gastrointestinal difficulties, the risk of activation and manic shifts, other common side effects, and the black box warning on suicidality.  Potential for serotonin syndrome when used in combination with other serotonergic agents discussed.   Continue propranolol 10 mg BID for anxiety     Laboratory Studies:  Most recent labs reviewed, No new labs needed at this time     Therapy:  Follows with , [facility] for therapy around once a month  Experience validated and supportive presence provided    Follow up:  -   Follow up Nov 11th at 3:30pm via in person with me.  - Patient advised to call the call center or send MyChart message with any questions or concerns.       Nov 13, MD  06/03/2020     Late entry for 06/03/2020   I saw and examined the patient in-person.  I consulted with the resident and reviewed the resident's note.  I agree with the findings and plan of care as documented in the resident's note.  Any exceptions/additions are edited/noted.  Carlye Grippe, MD 06/06/2020, 09:43

## 2020-06-13 ENCOUNTER — Encounter (HOSPITAL_BASED_OUTPATIENT_CLINIC_OR_DEPARTMENT_OTHER): Payer: Self-pay | Admitting: Student in an Organized Health Care Education/Training Program

## 2020-07-05 ENCOUNTER — Other Ambulatory Visit: Payer: Self-pay

## 2020-07-05 ENCOUNTER — Ambulatory Visit (INDEPENDENT_AMBULATORY_CARE_PROVIDER_SITE_OTHER): Payer: Medicaid Other | Admitting: Professional

## 2020-07-05 DIAGNOSIS — F329 Major depressive disorder, single episode, unspecified: Secondary | ICD-10-CM

## 2020-07-05 DIAGNOSIS — F332 Major depressive disorder, recurrent severe without psychotic features: Secondary | ICD-10-CM

## 2020-07-06 NOTE — Progress Notes (Signed)
Frederick Schultz   [id number]  [birth year]  07/05/2020    Chief Complaint   Patient presents with    Psychotherapy        Time in:  1400              Time out: 1459    SUBJECTIVE:  Frederick Schultz presents to clinic today per established outpatient therapy treatment plan. Frederick Schultz discussed the events that have occurred since our last session including his disability hearing. We reviewed the DBT skill, TIPP.     OBJECTIVE:     Orientation: Fully oriented to person, place, time and situation.  Marland Kitchen     Appearance:  casually dressed.     Eye Contact:  good.     Behavior:  cooperative.     Attention:  good.     Speech:  normal rate and volume.     Motor:  no psychomotor retardation or agitation.     Mood:  anxious.     Affect:  appears anxious.     Thought Process:  goal oriented.     Thought Content:  no paranoia or delusions.     Suicidal Ideation:  none.     Homicidal Ideation:  none   Perception:  no hallucinations endorsed   Cognition:  concrete.     Insight:  fair.     Judgement:  fair.     Goals/wishes:  develop coping skills for anxiety.     Other objective findings:  none    ASSESSMENT:    MDD    PROCEDURE:  Used supportive dynamic therapy to encourage identification and expression of feeling states.  Reinforced patient's self-efficacy.  Encouraged increased understanding and acceptance of feelings.  Assisted patient in identifying unproductive defense mechanisms with more helpful adaptive coping strategies. Used Dialectical Behavior therapy to decrease emotion dysregulation, learn effective interpersonal relationship skills, and increase crisis survival skills.     Inquired for suicidal ideation/homicidal ideation. Frederick Schultz does not endorse current SI or HI.  * Frederick Schultz has information and plan for safety and is willing to seek help if SI and/or HI  are present.    PLAN:    Return for outpatient individual therapy bi monthly as per therapy treatment plan.     Latina Craver, LPC, 07/06/2020, 10:10

## 2020-07-07 ENCOUNTER — Other Ambulatory Visit: Payer: Self-pay

## 2020-07-07 ENCOUNTER — Encounter (HOSPITAL_BASED_OUTPATIENT_CLINIC_OR_DEPARTMENT_OTHER): Payer: Self-pay | Admitting: Family Medicine

## 2020-07-07 ENCOUNTER — Ambulatory Visit: Payer: Medicaid Other | Attending: Family Medicine | Admitting: Family Medicine

## 2020-07-07 VITALS — BP 132/78 | HR 80 | Temp 98.2°F | Resp 16 | Ht 65.98 in | Wt 224.2 lb

## 2020-07-07 DIAGNOSIS — Z23 Encounter for immunization: Secondary | ICD-10-CM | POA: Insufficient documentation

## 2020-07-07 DIAGNOSIS — K279 Peptic ulcer, site unspecified, unspecified as acute or chronic, without hemorrhage or perforation: Secondary | ICD-10-CM | POA: Insufficient documentation

## 2020-07-07 DIAGNOSIS — N1831 Chronic kidney disease, stage 3a (CMS HCC): Secondary | ICD-10-CM

## 2020-07-07 DIAGNOSIS — Z6836 Body mass index (BMI) 36.0-36.9, adult: Secondary | ICD-10-CM

## 2020-07-07 MED ORDER — PANTOPRAZOLE 40 MG TABLET,DELAYED RELEASE
40.0000 mg | DELAYED_RELEASE_TABLET | Freq: Every day | ORAL | 3 refills | Status: DC
Start: 2020-07-07 — End: 2021-04-10

## 2020-07-07 NOTE — Patient Instructions (Signed)
Vaccine Information Statement    Influenza (Flu) Vaccine (Inactivated or Recombinant): What You Need to Know    Many vaccine information statements are available in Spanish and other languages. See www.immunize.org/vis.  Hojas de informacin sobre vacunas estn disponibles en espaol y en muchos otros idiomas. Visite www.immunize.org/vis.    1. Why get vaccinated?    Influenza vaccine can prevent influenza (flu).    Flu is a contagious disease that spreads around the [place] every year, usually between October and May. Anyone can get the flu, but it is more dangerous for some people. Infants and young children, people 65 years and older, pregnant people, and people with certain health conditions or a weakened immune system are at greatest risk of flu complications.    Pneumonia, bronchitis, sinus infections, and ear infections are examples of flu-related complications. If you have a medical condition, such as heart disease, cancer, or diabetes, flu can make it worse.    Flu can cause fever and chills, sore throat, muscle aches, fatigue, cough, headache, and runny or stuffy nose. Some people may have vomiting and diarrhea, though this is more common in children than adults.     In an average year, thousands of people in the [place] die from flu, and many more are hospitalized. Flu vaccine prevents millions of illnesses and flu-related visits to the doctor each year.    2. Influenza vaccines     CDC recommends everyone 6 months and older get vaccinated every flu season. Children 6 months through 8 years of age may need 2 doses during a single flu season. Everyone else needs only 1 dose each flu season.    It takes about 2 weeks for protection to develop after vaccination.    There are many flu viruses, and they are always changing. Each year a new flu vaccine is made to protect against the influenza viruses believed to be likely to cause disease in the upcoming flu season. Even when the vaccine doesn't  exactly match these viruses, it may still provide some protection.     Influenza vaccine does not cause flu.    Influenza vaccine may be given at the same time as other vaccines.    3. Talk with your health care provider    Tell your vaccination provider if the person getting the vaccine:  . Has had an allergic reaction after a previous dose of influenza vaccine, or has any severe, life-threatening allergies   . Has ever had Guillain-Barr Syndrome (also called "GBS")    In some cases, your health care provider may decide to postpone influenza vaccination until a future visit.    Influenza vaccine can be administered at any time during pregnancy. People who are or will be pregnant during influenza season should receive inactivated influenza vaccine.    People with minor illnesses, such as a cold, may be vaccinated. People who are moderately or severely ill should usually wait until they recover before getting influenza vaccine.    Your health care provider can give you more information.    4. Risks of a vaccine reaction    . Soreness, redness, and swelling where the shot is given, fever, muscle aches, and headache can happen after influenza vaccination.  . There may be a very small increased risk of Guillain-Barr Syndrome (GBS) after inactivated influenza vaccine (the flu shot).    Young children who get the flu shot along with pneumococcal vaccine (PCV13) and/or DTaP vaccine at the same time might be   slightly more likely to have a seizure caused by fever. Tell your health care provider if a child who is getting flu vaccine has ever had a seizure.    People sometimes faint after medical procedures, including vaccination. Tell your provider if you feel dizzy or have vision changes or ringing in the ears.    As with any medicine, there is a very remote chance of a vaccine causing a severe allergic reaction, other serious injury, or death.    5. What if there is a serious problem?    An allergic reaction could occur  after the vaccinated person leaves the clinic. If you see signs of a severe allergic reaction (hives, swelling of the face and throat, difficulty breathing, a fast heartbeat, dizziness, or weakness), call 9-1-1 and get the person to the nearest hospital.    For other signs that concern you, call your health care provider.    Adverse reactions should be reported to the Vaccine Adverse Event Reporting System (VAERS). Your health care provider will usually file this report, or you can do it yourself. Visit the VAERS website at www.vaers.hhs.gov or call 1-800-822-7967. VAERS is only for reporting reactions, and VAERS staff members do not give medical advice.    6. The National Vaccine Injury Compensation Program    The National Vaccine Injury Compensation Program (VICP) is a federal program that was created to compensate people who may have been injured by certain vaccines. Claims regarding alleged injury or death due to vaccination have a time limit for filing, which may be as short as two years. Visit the VICP website at www.hrsa.gov/vaccinecompensation or call 1-800-338-2382 to learn about the program and about filing a claim.     7. How can I learn more?    . Ask your health care provider.   . Call your local or state health department.   . Visit the website of the Food and Drug Administration (FDA) for vaccine package inserts and additional information at www.fda.gov/vaccines-blood-biologics/vaccines.  . Contact the Centers for Disease Control and Prevention (CDC):  - Call 1-800-232-4636 (1-800-CDC-INFO) or  - Visit CDC's influenza website at www.cdc.gov/flu.    Vaccine Information Statement   Inactivated Influenza Vaccine   04/15/2020  42 U[place]  300aa-[id number]   Department of Health and Human Services  Centers for Disease Control and Prevention    Office Use Only

## 2020-07-07 NOTE — Nursing Note (Signed)
1. Are you 39 years of age or older? yes  2. Have you ever had a severe reaction to a flu shot? no  3. Are you allergic to eggs? no  4. Are you allergic to latex? no  5. Are you allergic to Thimerosol? no  6. Are you experiencing acute illness symptoms or have you been running a fever? no  7. Do you have a medical condition or taking medications that suppress your immune system? no  8. Have you ever had Guillain-Barre syndrome or other neurologic disorder? no  9. Are you pregnant or breastfeeding? no    Immunization administered     Name Date Dose VIS Date Route    Influenza Vaccine, 6 month-adult 07/07/2020 0.5 mL 04/15/2020 Intramuscular    Site: Left deltoid    Given By: Carlos Levering, MA    Manufacturer: [employer]    Lot: [id number]    NDC: [id number]          Carlos Levering, MA 07/07/2020, 16:50

## 2020-07-07 NOTE — Progress Notes (Addendum)
Family Medicine, [facility]  [address]  [place] [address]  [phone]     Clinical Progress Note      KENYADA HY  Date of Service: 08/01/2020    Chief complaint:   Chief Complaint   Patient presents with    Abdominal Pain     follow up-feeling better       SUBJECTIVE:  Frederick Schultz is a 38 y.o. male presenting for Abdominal Pain (follow up-feeling better)    Patient was previously worked up for peptic ulcer disease after significant RUQ pain with undetected H. Pylori and CBC WNL. Patient was started on Protonix 40 mg daily. Patient reports stomach pain went away almost immediately after starting the medication. Patient has no concerns at this time.     Patient has a past medical history including HTN managed with Amlodipine 10 mg once daily and triamterene-hydrochlorothiazide 75-50 mg once daily. Patient reports home blood pressure monitoring range around 120/70. Patient compliant with and well managed on current medication regimen.     Patient reports sleep is ok and mood today is good. Patient is well managed with current medication regimen - Vistaril 50 mg nightly prn, Paxil 40 mg daily, and Inderal 10 mg twice daily.     I have reviewed the patient's medical, surgical, family and social history in detail and updated the computerized patient record. Patient with no other concerns at this time.     Current Outpatient Medications   Medication Sig    amLODIPine (NORVASC) 10 mg Oral Tablet Take 1 Tab (10 mg total) by mouth Once a day    diclofenac sodium (VOLTAREN) 1 % Gel by Apply Topically route Four times a day - before meals and bedtime    hydrOXYzine pamoate (VISTARIL) 50 mg Oral Capsule Take 1 Capsule (50 mg total) by mouth Every night as needed for Other (insomnia)    naproxen (NAPROSYN) 500 mg Oral Tablet Take 1 Tab (500 mg total) by mouth Every 12 hours as needed for Pain    pantoprazole (PROTONIX) 40 mg Oral Tablet, Delayed Release (E.C.) Take 1 Tablet  (40 mg total) by mouth Once a day    PARoxetine (PAXIL) 40 mg Oral Tablet Take 1 Tablet (40 mg total) by mouth Once a day    propranoloL (INDERAL) 10 mg Oral Tablet Take 1 Tablet (10 mg total) by mouth Twice daily    triamterene-hydroCHLOROthiazide (MAXZIDE) 75-50 mg Oral Tablet Take 1 Tab by mouth Once a day       ROS: Pertinent items are noted in HPI.    OBJECTIVE:   Vitals: BP 132/78    Pulse 80    Temp 36.8 C (98.2 F)    Resp 16    Ht 1.676 m (5' 5.98")    Wt 102 kg (224 lb 3.3 oz)    SpO2 97%    BMI 36.21 kg/m     Appearance:in no apparent distress and well developed and well nourished  Exam: General appearance: alert, cooperative, no distress, appears stated age  Head: Normocephalic, without obvious abnormality, atraumatic  Eyes: conjunctivae/corneas clear. PERRL, EOM's intact. Fundi benign  Lungs: clear to auscultation bilaterally  Heart: regular rate and rhythm, S1, S2 normal, no murmur, click, rub or gallop  Abdomen: soft, non-tender. Bowel sounds normal. No masses,  no organomegaly  Extremities: extremities normal, atraumatic, no cyanosis or edema  Skin: Skin color, texture, turgor normal. No rashes or lesions      ASSESSMENT:  ICD-10-CM    1. Peptic ulcer disease  K27.9    2. Stage 3a chronic kidney disease (CMS HCC)  N18.31 BASIC METABOLIC PANEL, FASTING     URINALYSIS, MACROSCOPIC AND MICROSCOPIC     PLAN:    Peptic Ulcer Disease   - REFILL Protonix 40 mg daily.   - Counseled on diet modification to further control symptoms and prevent recurrence.     Stage 3a CKD   - Creatinine elevated at 1.54 (1 month ago), 1.41 (2 years ago). Concern for underlying kidney pathology vs side effect of triamterene-hydrochlorothiazide.   - REPEAT BMP and urinalysis before next visit.     Health Maintenance   - Influenza vaccine administered today.     Return office visit in 6 months.     Daymon Larsen, MS3 MED STUDENT  [facility]           FAMILY MEDICINE, [facility] [address]  [place] [address]  [phone]    I was present when the student was taking history, performing the exam, and during any medical decision making activities.  I personally verified the history, performed the exam, and medical decision making as edited in the students note.  I agree with the medical students note.  Leda Roys, DO

## 2020-07-21 ENCOUNTER — Encounter (HOSPITAL_COMMUNITY): Payer: Medicaid Other | Admitting: Student in an Organized Health Care Education/Training Program

## 2020-08-01 ENCOUNTER — Encounter (HOSPITAL_BASED_OUTPATIENT_CLINIC_OR_DEPARTMENT_OTHER): Payer: Self-pay | Admitting: Family Medicine

## 2020-08-23 ENCOUNTER — Other Ambulatory Visit: Payer: Self-pay

## 2020-08-23 ENCOUNTER — Ambulatory Visit (INDEPENDENT_AMBULATORY_CARE_PROVIDER_SITE_OTHER): Payer: Medicaid Other | Admitting: Professional

## 2020-08-23 DIAGNOSIS — F329 Major depressive disorder, single episode, unspecified: Secondary | ICD-10-CM

## 2020-08-23 DIAGNOSIS — F332 Major depressive disorder, recurrent severe without psychotic features: Secondary | ICD-10-CM

## 2020-08-24 NOTE — Progress Notes (Signed)
Frederick Schultz   [id number]  May 06, 1981  08/23/2020    Chief Complaint   Patient presents with    Psychotherapy        Time in:  1400              Time out: 1440    SUBJECTIVE:  Frederick Schultz presents to clinic today per established outpatient therapy treatment plan. [name] discussed the events that have occurred since our last session including an increase in his mood. He processed the disability outcome again and we again reviewed his job history. We discussed areas that he could focus on improving, including non-judgmental stance.     OBJECTIVE:     Orientation: Fully oriented to person, place, time and situation.  Marland Kitchen     Appearance:  casually dressed.     Eye Contact:  good.     Behavior:  cooperative.     Attention:  good.     Speech:  normal rate and volume.     Motor:  no psychomotor retardation or agitation.     Mood:  anxious.     Affect:  appears anxious.     Thought Process:  goal oriented.     Thought Content:  no paranoia or delusions.     Suicidal Ideation:  none.     Homicidal Ideation:  none   Perception:  no hallucinations endorsed   Cognition:  concrete.     Insight:  fair.     Judgement:  fair.     Goals/wishes:  develop coping skills for anxiety.     Other objective findings:  none    ASSESSMENT:    MDD    PROCEDURE:  Used supportive dynamic therapy to encourage identification and expression of feeling states.  Reinforced patient's self-efficacy.  Encouraged increased understanding and acceptance of feelings.  Assisted patient in identifying unproductive defense mechanisms with more helpful adaptive coping strategies. Used Dialectical Behavior therapy to decrease emotion dysregulation, learn effective interpersonal relationship skills, and increase crisis survival skills.     Inquired for suicidal ideation/homicidal ideation. Frederick Schultz does not endorse current SI or HI.  * Frederick Schultz has information and plan for safety and is willing to seek help if SI and/or HI  are  present.    PLAN:    Return for outpatient individual therapy bi monthly as per therapy treatment plan.     Latina Craver, LPC, 08/24/2020, 10:46

## 2020-10-04 ENCOUNTER — Other Ambulatory Visit (HOSPITAL_COMMUNITY): Payer: Self-pay | Admitting: Student in an Organized Health Care Education/Training Program

## 2020-10-04 ENCOUNTER — Encounter (HOSPITAL_COMMUNITY): Payer: Self-pay

## 2020-10-11 ENCOUNTER — Telehealth (HOSPITAL_COMMUNITY): Payer: Self-pay

## 2020-10-21 ENCOUNTER — Other Ambulatory Visit (HOSPITAL_BASED_OUTPATIENT_CLINIC_OR_DEPARTMENT_OTHER): Payer: Self-pay | Admitting: Family Medicine

## 2020-10-25 ENCOUNTER — Ambulatory Visit (HOSPITAL_BASED_OUTPATIENT_CLINIC_OR_DEPARTMENT_OTHER): Payer: Self-pay | Admitting: Family Medicine

## 2020-10-25 NOTE — Telephone Encounter (Signed)
Regarding: Pt needs a med refill  ----- Message from Kathyrn Sheriff Little sent at 10/25/2020 11:56 AM EST -----  Leda Roys, DO    Pharmacy called in requesting a refill for the following Rx:    Current Outpatient Medications:  triamterene-hydroCHLOROthiazide (MAXZIDE) 75-50 mg Oral Tablet, Take 1 Tab by mouth Once a day         Preferred Pharmacy     [facility] [address], [place] - [address]   [address]    [facility] [facility] [address] [place] [address]    Phone: [phone] Fax: [phone]    Hours: Not open 24 hours        Thanks,  Kathyrn Sheriff Little

## 2020-10-25 NOTE — Telephone Encounter (Signed)
90 day supply sent to pharmacy.    Please notify the patient that he needs to complete labs prior to our visit in March.    Thanks,    Leda Roys, DO

## 2020-10-31 ENCOUNTER — Encounter (HOSPITAL_BASED_OUTPATIENT_CLINIC_OR_DEPARTMENT_OTHER): Payer: Self-pay | Admitting: Family Medicine

## 2020-11-01 ENCOUNTER — Ambulatory Visit (INDEPENDENT_AMBULATORY_CARE_PROVIDER_SITE_OTHER): Payer: Medicaid Other | Admitting: Professional

## 2020-11-01 ENCOUNTER — Other Ambulatory Visit: Payer: Self-pay

## 2020-11-01 DIAGNOSIS — F329 Major depressive disorder, single episode, unspecified: Secondary | ICD-10-CM

## 2020-11-01 DIAGNOSIS — F331 Major depressive disorder, recurrent, moderate: Secondary | ICD-10-CM

## 2020-11-03 ENCOUNTER — Other Ambulatory Visit (HOSPITAL_COMMUNITY): Payer: Self-pay | Admitting: Student in an Organized Health Care Education/Training Program

## 2020-11-03 ENCOUNTER — Other Ambulatory Visit (HOSPITAL_BASED_OUTPATIENT_CLINIC_OR_DEPARTMENT_OTHER): Payer: Self-pay | Admitting: Family Medicine

## 2020-11-03 DIAGNOSIS — I10 Essential (primary) hypertension: Secondary | ICD-10-CM

## 2020-11-03 NOTE — Progress Notes (Signed)
Frederick Schultz   [id number]  [birth year]  11/01/2020    Chief Complaint   Patient presents with    Psychotherapy        Time in:  1400              Time out: 1500    SUBJECTIVE:  Frederick Schultz presents to clinic today per established outpatient therapy treatment plan. Frederick Schultz discussed the events that have occurred since our last session including his continued Depressive symptoms. He also reports an unwillingness to try the skills we discuss or attempt to become more active/motivated to get out of bed during the day. We discussed the role judgements have on his life.     OBJECTIVE:     Orientation: Fully oriented to person, place, time and situation.  Marland Kitchen     Appearance:  casually dressed.     Eye Contact:  good.     Behavior:  cooperative.     Attention:  good.     Speech:  normal rate and volume.     Motor:  no psychomotor retardation or agitation.     Mood:  anxious.     Affect:  appears anxious.     Thought Process:  goal oriented.     Thought Content:  no paranoia or delusions.     Suicidal Ideation:  none.     Homicidal Ideation:  none   Perception:  no hallucinations endorsed   Cognition:  concrete.     Insight:  fair.     Judgement:  fair.     Goals/wishes:  develop coping skills for anxiety.     Other objective findings:  none    ASSESSMENT:    MDD    PROCEDURE:  Used supportive dynamic therapy to encourage identification and expression of feeling states.  Reinforced patient's self-efficacy.  Encouraged increased understanding and acceptance of feelings.  Assisted patient in identifying unproductive defense mechanisms with more helpful adaptive coping strategies. Used Dialectical Behavior therapy to decrease emotion dysregulation, learn effective interpersonal relationship skills, and increase crisis survival skills.     Inquired for suicidal ideation/homicidal ideation. Frederick Schultz does not endorse current SI or HI.  * Frederick Schultz has information and plan for safety and is willing to seek help if  SI and/or HI  are present.    PLAN:    Return for outpatient individual therapy bi monthly as per therapy treatment plan.     Latina Craver, LPC, 11/03/2020, 15:02

## 2020-11-17 ENCOUNTER — Ambulatory Visit: Payer: Medicaid Other | Attending: Family Medicine | Admitting: Family Medicine

## 2020-11-17 ENCOUNTER — Encounter (HOSPITAL_BASED_OUTPATIENT_CLINIC_OR_DEPARTMENT_OTHER): Payer: Self-pay | Admitting: Family Medicine

## 2020-11-17 ENCOUNTER — Other Ambulatory Visit: Payer: Self-pay

## 2020-11-17 VITALS — BP 104/68 | HR 80 | Temp 98.8°F | Resp 18 | Ht 65.0 in | Wt 217.6 lb

## 2020-11-17 DIAGNOSIS — R229 Localized swelling, mass and lump, unspecified: Secondary | ICD-10-CM | POA: Insufficient documentation

## 2020-11-17 DIAGNOSIS — Z114 Encounter for screening for human immunodeficiency virus [HIV]: Secondary | ICD-10-CM | POA: Insufficient documentation

## 2020-11-17 DIAGNOSIS — M25512 Pain in left shoulder: Secondary | ICD-10-CM | POA: Insufficient documentation

## 2020-11-17 DIAGNOSIS — F39 Unspecified mood [affective] disorder: Secondary | ICD-10-CM | POA: Insufficient documentation

## 2020-11-17 DIAGNOSIS — G2581 Restless legs syndrome: Secondary | ICD-10-CM | POA: Insufficient documentation

## 2020-11-17 DIAGNOSIS — N1831 Chronic kidney disease, stage 3a: Secondary | ICD-10-CM | POA: Insufficient documentation

## 2020-11-17 DIAGNOSIS — Z6836 Body mass index (BMI) 36.0-36.9, adult: Secondary | ICD-10-CM

## 2020-11-17 DIAGNOSIS — I129 Hypertensive chronic kidney disease with stage 1 through stage 4 chronic kidney disease, or unspecified chronic kidney disease: Secondary | ICD-10-CM | POA: Insufficient documentation

## 2020-11-17 DIAGNOSIS — I10 Essential (primary) hypertension: Secondary | ICD-10-CM

## 2020-11-17 DIAGNOSIS — Z113 Encounter for screening for infections with a predominantly sexual mode of transmission: Secondary | ICD-10-CM | POA: Insufficient documentation

## 2020-11-17 DIAGNOSIS — Z23 Encounter for immunization: Secondary | ICD-10-CM

## 2020-11-17 DIAGNOSIS — G8929 Other chronic pain: Secondary | ICD-10-CM | POA: Insufficient documentation

## 2020-11-17 NOTE — Progress Notes (Signed)
Family Medicine, [facility]  [address]  [place] [address]  [phone]     Clinical Progress Note        Frederick Schultz  Date of Service: 11/17/2020    Chief complaint:   Chief Complaint   Patient presents with   . Follow Up 6 Months       Subjective:    39 y.o.male with a past medical history of HTN, mood disorder (GAD and MDD) and chronic back pain presents in follow up for chronic disease management with acute concern for penile cyst.     Frederick Schultz has been experiencing chronic fatigue. No specific triggers.     In regards to his mood, he has passive SI. Does follow with Behavioral Medicine. States he does not want to commit suicide and thinks that if he committed suicide they aliens would come and he would miss something cool. Currently taking Paxil 40 mg daily and Inderal 10 mg twice daily.  He does have hydroxyzine available but "wants something to knock him out".     Does have a lesion on his penile shaft. It has been there for the past few weeks. Noticed on the first week of February. It is not painful. He has no penile discharge. Does have a past history of male and male partners. Last intercourse 33yrs ago.    His shoulder clicks. This has been bothering him his entire life. Does not play sports. No injury.    In regards to his hypertension, currently taking amlodipine 10 mg once daily and triamterene-hydrochlorothiazide 75-50 mg once daily.       Medications:  Outpatient Medications Marked as Taking for the 11/17/20 encounter (Office Visit) with Leda Roys, DO   Medication Sig   . amLODIPine (NORVASC) 10 mg Oral Tablet Take 1 Tablet (10 mg total) by mouth Once a day   . diclofenac sodium (VOLTAREN) 1 % Gel by Apply Topically route Four times a day - before meals and bedtime   . pantoprazole (PROTONIX) 40 mg Oral Tablet, Delayed Release (E.C.) Take 1 Tablet (40 mg total) by mouth Once a day   . PARoxetine (PAXIL) 40 mg Oral Tablet Take 1 tablet by mouth  once daily   . propranoloL (INDERAL) 10 mg Oral Tablet Take 1 Tablet (10 mg total) by mouth Twice daily   . triamterene-hydroCHLOROthiazide (MAXZIDE) 75-50 mg Oral Tablet Take 1 Tablet by mouth Once a day          Allergies:  Allergies   Allergen Reactions   . Asa Buff (Mag Carb-Al Glyc) Eilene Ghazi, Buffered]        Medical History:  Past Medical History:   Diagnosis Date   . Anxiety    . Asthma    . Bipolar 1 disorder (CMS HCC)    . Depression            Objective:  Vitals: BP 104/68   Pulse 80   Temp 37.1 C (98.8 F) (Thermal Scan)   Resp 18   Ht 1.651 m (5\' 5" )   Wt 98.7 kg (217 lb 9.5 oz)   SpO2 96%   BMI 36.21 kg/m       General: Well developed, well nourished. No acute distress  HEENT: Normocephalic, atraumatic. Extra occular movements intact bilaterally  Heart: Regular rate and rhythm, no appreciable murmurs, rubs, or gallops  Lungs: Clear to ascultation bilaterally, no appreciable wheezes, rales, or rhonchi  Abdomen: Bowel Sounds present in all 4  quadrants, soft, non-tender, non-distended, no appreciable rebound, guarding, or rigidity  Extremities: No appreciable clubbing, cyanosis, or edema  Psych: Awake, alert, pleasant, mood euthymic  Genitourinary: Four well circumscribed, mobile, non-tender, subdermal nodular lesions. Circumcised. No visible rashes or ulcerations.       Assessment/Plan:  1. Essential hypertension   -- Well controlled  -- Cont amlodipine 10mg  daily   -- Cont triamterene-HCTZ 75-50mg  daily  -- UPDATE BMP     2. Skin nodule   -- Notable nodules under the skin of the penile shaft. No evidence of ulceation. No frank pain. Appear cystic. Frederick Schultz is to message me in a few weeks if these are persisent and I am going to ask some of the male providers in our office if they have ever seen anything like this before.      3. Stage 3a chronic kidney disease (CMS HCC)   -- UPDATE BMP and Vit D     4. Mood disorder (CMS HCC)   -- Passive SI. Does follow with Behavioral Medicine  -- Cont  paroxetine 40mg  daily  -- Cont propranolol 10mg  BID  -- Cont hydroxyzine 50mg  QHS prn     5. Hyperbilirubinemia   -- UPDATE LFTs     6. Chronic left shoulder pain   -- Persistent. Likely some tendonosis, but will update imaging to assure there is no arthritis  -- ORDER left shoulder Xray     7. RLS (restless legs syndrome)   -- UPDATE Iron Panel and Magnesium     9. Routine screening for STI (sexually transmitted infection) ; Encounter for screening for HIV   -- UPDATE GC/Chlamydia, RPR, Hep C, and HIV     10. Need for pneumococcal vaccination   -- UPDATE pneumovax       Return in about 8 weeks (around 01/12/2021) for recheck labs and skin lesion.      Frederick Schultz B. , DO

## 2020-11-17 NOTE — Nursing Note (Signed)
PHQ Questionnaire  Little interest or pleasure in doing things.: Not at all  Feeling down, depressed, or hopeless: Several Days  PHQ 2 Total: 1  Trouble falling or staying asleep, or sleeping too much.: Nearly every day  Feeling tired or having little energy: Nearly every day  Poor appetite or overeating: Not at all  Feeling bad about yourself/ that you are a failure in the past 2 weeks?: Not at all  Trouble concentrating on things in the past 2 weeks?: Several Days  Moving/Speaking slowly or being fidgety or restless  in the past 2 weeks?: Nearly every day  Thoughts that you would be better off DEAD, or of hurting yourself in some way.: Not at all  If you checked off any problems, how difficult have these problems made it for you to do your work, take care of things at home, or get along with other people?: Somewhat difficult  PHQ 9 Total: 11

## 2020-11-29 ENCOUNTER — Ambulatory Visit (INDEPENDENT_AMBULATORY_CARE_PROVIDER_SITE_OTHER): Payer: Medicaid Other | Admitting: Professional

## 2020-11-29 ENCOUNTER — Other Ambulatory Visit: Payer: Self-pay

## 2020-11-29 DIAGNOSIS — F329 Major depressive disorder, single episode, unspecified: Secondary | ICD-10-CM

## 2020-11-29 DIAGNOSIS — F331 Major depressive disorder, recurrent, moderate: Secondary | ICD-10-CM

## 2020-11-29 NOTE — Progress Notes (Signed)
Frederick Schultz   [id number]  05-16-1981  11/29/2020    Chief Complaint   Patient presents with   . Psychotherapy        Time in:  1405              Time out: 1505    SUBJECTIVE:  Frederick Schultz presents to clinic today per established outpatient therapy treatment plan. Dowell discussed the events that have occurred since our last session including his continued Depressive symptoms. We discussed the DBT assumption that everyone is doing the best that they can.    OBJECTIVE:     Orientation: Fully oriented to person, place, time and situation.  Marland Kitchen     Appearance:  casually dressed.     Eye Contact:  good.     Behavior:  cooperative.     Attention:  good.     Speech:  normal rate and volume.     Motor:  no psychomotor retardation or agitation.     Mood:  anxious.     Affect:  appears anxious.     Thought Process:  goal oriented.     Thought Content:  no paranoia or delusions.     Suicidal Ideation:  none.     Homicidal Ideation:  none   Perception:  no hallucinations endorsed   Cognition:  concrete.     Insight:  fair.     Judgement:  fair.     Goals/wishes:  develop coping skills for anxiety.     Other objective findings:  none    ASSESSMENT:    MDD    PROCEDURE:  Used supportive dynamic therapy to encourage identification and expression of feeling states.  Reinforced patient's self-efficacy.  Encouraged increased understanding and acceptance of feelings.  Assisted patient in identifying unproductive defense mechanisms with more helpful adaptive coping strategies. Used Dialectical Behavior therapy to decrease emotion dysregulation, learn effective interpersonal relationship skills, and increase crisis survival skills.     Inquired for suicidal ideation/homicidal ideation. Frederick Schultz does not endorse current SI or HI.  * Frederick Schultz has information and plan for safety and is willing to seek help if SI and/or HI  are present.    PLAN:    Return for outpatient individual therapy bi monthly as per therapy  treatment plan.     Latina Craver, LPC, 11/29/2020, 15:22

## 2020-11-30 ENCOUNTER — Ambulatory Visit (HOSPITAL_BASED_OUTPATIENT_CLINIC_OR_DEPARTMENT_OTHER): Payer: Medicaid Other

## 2020-11-30 ENCOUNTER — Ambulatory Visit
Admission: RE | Admit: 2020-11-30 | Discharge: 2020-11-30 | Disposition: A | Discharge status: Home / Self Care | Payer: Medicaid Other | Source: Ambulatory Visit | Attending: Family Medicine | Admitting: Family Medicine

## 2020-11-30 DIAGNOSIS — Z114 Encounter for screening for human immunodeficiency virus [HIV]: Secondary | ICD-10-CM | POA: Insufficient documentation

## 2020-11-30 DIAGNOSIS — I10 Essential (primary) hypertension: Secondary | ICD-10-CM

## 2020-11-30 DIAGNOSIS — N1831 Chronic kidney disease, stage 3a (CMS HCC): Secondary | ICD-10-CM

## 2020-11-30 DIAGNOSIS — Z113 Encounter for screening for infections with a predominantly sexual mode of transmission: Secondary | ICD-10-CM | POA: Insufficient documentation

## 2020-11-30 DIAGNOSIS — G8929 Other chronic pain: Secondary | ICD-10-CM | POA: Insufficient documentation

## 2020-11-30 DIAGNOSIS — G2581 Restless legs syndrome: Secondary | ICD-10-CM

## 2020-11-30 DIAGNOSIS — M25512 Pain in left shoulder: Secondary | ICD-10-CM | POA: Insufficient documentation

## 2020-11-30 LAB — HEPATIC FUNCTION PANEL
ALBUMIN: 4.4 g/dL (ref 3.5–5.0)
ALKALINE PHOSPHATASE: 71 U/L (ref 45–115)
ALT (SGPT): 22 U/L (ref 10–55)
AST (SGOT): 19 U/L (ref 8–45)
BILIRUBIN DIRECT: 0.5 mg/dL — ABNORMAL HIGH (ref 0.1–0.4)
BILIRUBIN TOTAL: 1.6 mg/dL — ABNORMAL HIGH (ref 0.3–1.3)
PROTEIN TOTAL: 7.5 g/dL (ref 6.4–8.3)

## 2020-11-30 LAB — BASIC METABOLIC PANEL
ANION GAP: 10 mmol/L (ref 4–13)
BUN/CREA RATIO: 13 (ref 6–22)
BUN: 20 mg/dL (ref 8–25)
CALCIUM: 9.9 mg/dL (ref 8.5–10.0)
CHLORIDE: 101 mmol/L (ref 96–111)
CO2 TOTAL: 32 mmol/L — ABNORMAL HIGH (ref 22–30)
CREATININE: 1.49 mg/dL — ABNORMAL HIGH (ref 0.75–1.35)
ESTIMATED GFR: 58 mL/min/BSA — ABNORMAL LOW (ref >=60)
GLUCOSE: 93 mg/dL (ref 65–125)
POTASSIUM: 3.3 mmol/L — ABNORMAL LOW (ref 3.5–5.1)
SODIUM: 143 mmol/L (ref 136–145)

## 2020-11-30 LAB — IRON TRANSFERRIN AND TIBC
IRON (TRANSFERRIN) SATURATION: 36 % (ref 15–50)
IRON: 123 ug/dL (ref 55–175)
TOTAL IRON BINDING CAPACITY: 344 ug/dL (ref 252–504)
TRANSFERRIN: 246 mg/dL (ref 180–360)

## 2020-11-30 LAB — HIV1/HIV2 SCREEN, COMBINED ANTIGEN AND ANTIBODY: HIV SCREEN, COMBINED ANTIGEN & ANTIBODY: NEGATIVE

## 2020-11-30 LAB — HEPATITIS C ANTIBODY SCREEN WITH REFLEX TO HCV PCR: HCV ANTIBODY QUALITATIVE: NEGATIVE

## 2020-11-30 LAB — SYPHILIS SCREENING ALGORITHM WITH REFLEX, SERUM: SYPHILIS TP ANTIBODIES: NONREACTIVE

## 2020-11-30 LAB — MAGNESIUM: MAGNESIUM: 2 mg/dL (ref 1.8–2.6)

## 2020-12-01 LAB — VITAMIN D 25, TOTAL: VITAMIN D, 25OH: 42 ng/mL (ref 30–100)

## 2020-12-02 ENCOUNTER — Other Ambulatory Visit (HOSPITAL_COMMUNITY): Payer: Self-pay | Admitting: Student in an Organized Health Care Education/Training Program

## 2020-12-05 ENCOUNTER — Encounter (HOSPITAL_COMMUNITY): Payer: Self-pay

## 2020-12-11 ENCOUNTER — Encounter (HOSPITAL_BASED_OUTPATIENT_CLINIC_OR_DEPARTMENT_OTHER): Payer: Self-pay | Admitting: Family Medicine

## 2020-12-15 ENCOUNTER — Ambulatory Visit (INDEPENDENT_AMBULATORY_CARE_PROVIDER_SITE_OTHER): Payer: Medicaid Other | Admitting: Student in an Organized Health Care Education/Training Program

## 2020-12-15 ENCOUNTER — Other Ambulatory Visit: Payer: Self-pay

## 2020-12-15 DIAGNOSIS — F419 Anxiety disorder, unspecified: Secondary | ICD-10-CM

## 2020-12-15 DIAGNOSIS — F411 Generalized anxiety disorder: Secondary | ICD-10-CM

## 2020-12-15 DIAGNOSIS — F331 Major depressive disorder, recurrent, moderate: Secondary | ICD-10-CM

## 2020-12-15 MED ORDER — PROPRANOLOL 10 MG TABLET
10.0000 mg | ORAL_TABLET | Freq: Two times a day (BID) | ORAL | 5 refills | Status: DC
Start: 2020-12-15 — End: 2021-02-16

## 2020-12-15 MED ORDER — HYDROXYZINE PAMOATE 50 MG CAPSULE
50.0000 mg | ORAL_CAPSULE | Freq: Every evening | ORAL | 2 refills | Status: DC | PRN
Start: 2020-12-15 — End: 2022-02-12

## 2020-12-15 MED ORDER — PAROXETINE 30 MG TABLET
60.0000 mg | ORAL_TABLET | Freq: Every evening | ORAL | 5 refills | Status: DC
Start: 2020-12-15 — End: 2021-06-16

## 2020-12-15 NOTE — Progress Notes (Signed)
BEHAVIORAL MEDICINE, [place] [place]  [address]  [facility] [place] [address]    IN PERSON VISIT      Name: Frederick Schultz  MRN: [id number]    Date: 12/15/2020  Age: 40 y.o.       Chief complaint: Med check    Subjective:  Frederick Schultz is a 40 y.o. male presenting for medication management for MDD and anxiety.      Reports being irritable over the last 4 days.  Reports doing better today.  Reports he has been off the paroxetine over the last week or so.  Had been taking the medication consistently prior to this.  Patient reports that he had ran out of refills and was under the impression he had to wait until his next appointment to before he could get another.  Encouraged patient to send a message to prescriber if this occurs again as there are now more people involved in script requests.  Anxiety and mood were "stable" prior to this and he reports it was much better comparatively but that he has learned to "grade on a curve".  No problems with side effects.  Sleep poor chronically due to problems with getting to sleep.  Endorses he has been told he snores but denies being told that he has apneic episodes and indicates he does not wake gasping for breath. Takes vistaril about once a week at night for sleep which takes 2 hours to work and makes it more difficult to wake in the morning.  Does take naps during the day.  No SI, HI, or AVH.    Side-effects/ROS:   Denies dizziness/lightheadedness  Denies changes in appetite  Denies nausea, vomiting, diarrhea  Denies anticholinergic side effects.    Collateral: EMR  Psych hx:  Attempted suicide at 4 and was hospitalized for 2 weeks for this.  Medication Trials: Has tried Prozac (made "hyper" per patient as a child), Lithium (felt effective but was taken off due to concerns for potential long term side effects - denies ever having side effects or elevated levels on this), Depakote, trazodone - took once (got priapism), Buspar ineffective, Wellbutrin - lack of efficacy;  vistaril - perceived lack of efficacy; Zoloft 200 mg DAILY - RLS-type sensations; Cymbalta 60 mg DAILY (continued anxiety despite optimizing dose and had return of RLS type phenomena); Lexapro 20 mg DAILY (mood and anxiety remained suboptimally controlled)  Medical hx:  Back pain; HTN  Social:  Primarily stays at home.  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      Outpatient medications:    Current Outpatient Medications   Medication Sig    amLODIPine (NORVASC) 10 mg Oral Tablet Take 1 Tablet (10 mg total) by mouth Once a day    diclofenac sodium (VOLTAREN) 1 % Gel by Apply Topically route Four times a day - before meals and bedtime    hydrOXYzine pamoate (VISTARIL) 50 mg Oral Capsule Take 1 Capsule (50 mg total) by mouth Every night as needed for Other (insomnia)    naproxen (NAPROSYN) 500 mg Oral Tablet Take 1 Tab (500 mg total) by mouth Every 12 hours as needed for Pain    pantoprazole (PROTONIX) 40 mg Oral Tablet, Delayed Release (E.C.) Take 1 Tablet (40 mg total) by mouth Once a day    PARoxetine (PAXIL) 40 mg Oral Tablet Take 1 tablet by mouth once daily    propranoloL (INDERAL) 10 mg Oral Tablet Take 1 Tablet (  10 mg total) by mouth Twice daily    triamterene-hydroCHLOROthiazide (MAXZIDE) 75-50 mg Oral Tablet Take 1 Tablet by mouth Once a day        Objective:  Physical Exam:   There were no vitals taken for this visit.  General:  appears in good health, appears stated age and vital signs reviewed  Eyes:  Conjunctiva clear; no nystagmus; no obvious EOM abnormalities  HENT:  Head is normocephalic, atraumatic   Neck:  Midline  Heart:  No cyanosis or JVD  Lungs:  Nonlabored breathing.    Abdomen:  Nondistended.  Protuberant.  Extremities:  No clubbing or edema on exposed skin  Neurologic:  Nontremulous.  Alert and grossly oriented.    Mental Status Exam:  Appearance:  casually dressed and appears actual age; wearing beanie cap and red shoes  Behavior:  cooperative and eye  contact limited - often looks down  Motor/MS:  normal gait; no tremor  Speech:  Normal rate and prosody  Mood:  "not as irritable today"  Affect:  Normal range  Perception:  normal  Thought:  goal directed/coherent  Thought Content:  appropriate   Suicidal Ideation:  none.    Homicidal Ideation:  none  Level of Consciousness:  alert  Orientation:  grossly normal  Concentration:  good  Memory:  grossly normal  Conceptual:  abstract thinking  Judgement:  Fair to good  Insight:  Fair to good    Assessment:  Frederick Schultz is a 40 y.o. male with MDD and GAD presenting for med check.  Patient tolerating Paxil 40mg  HS without side effects but out of it for the last week or so.  Some residual depression and anxiety at that dose and also having chronic problems with sleep initiation.  No acute safety concerns at this time.    Psychiatric Diagnoses: MDD, recurrent, moderate, without psychotic features; and GAD    Plan:    Discussed risks, benefits, and alternatives. Patient agreeable to plan as described below.   Medications:  No orders of the defined types were placed in this encounter.      Increase Paxil 40 mg HS to 60 mg HS - (instructed patient to take as 30mg  HS for about 4 days before increasing to the 60mg  HS given he has been off this for about a week)    The risks and benefits and alternatives of Serotonin Selective Reuptake Inhibitors have been discussed, including but not limited to: nausea and gastrointestinal difficulties, the risk of activation and manic shifts, other common side effects, and the black box warning on suicidality.  Potential for serotonin syndrome when used in combination with other serotonergic agents discussed.   Continue propranolol 10 mg BID for anxiety   Continue vistaril 50mg  HS-PRN for insomnia    Laboratory Studies:  Most recent labs reviewed, No new labs needed at this time     Therapy:  Follows with , [facility] for therapy around once a month  Experience validated and supportive  presence provided    Follow up:  -  Follow up June 9th for follow-up with me in person  Discussed eventual TOC to another resident in the June-July timeframe to coincide with this provider's completion of PGY4 and therefore residency.  - Patient advised to call the call center or send MyChart message with any questions or concerns.       Levonne Hubert, MD  12/15/2020     I saw and examined the patient on 12/15/2020.  I reviewed  the resident's note.  I agree with the findings and plan of care as documented in the resident's note.  Any exceptions/additions are edited/noted.    Vladimir Crofts, MD  12/15/2020, 10:07

## 2020-12-22 ENCOUNTER — Encounter (HOSPITAL_COMMUNITY): Payer: Medicaid Other | Admitting: Student in an Organized Health Care Education/Training Program

## 2020-12-29 ENCOUNTER — Other Ambulatory Visit: Payer: Self-pay

## 2020-12-29 ENCOUNTER — Ambulatory Visit (INDEPENDENT_AMBULATORY_CARE_PROVIDER_SITE_OTHER): Payer: Medicaid Other | Admitting: Professional

## 2020-12-29 DIAGNOSIS — F331 Major depressive disorder, recurrent, moderate: Secondary | ICD-10-CM

## 2020-12-29 DIAGNOSIS — F329 Major depressive disorder, single episode, unspecified: Secondary | ICD-10-CM

## 2020-12-31 NOTE — Progress Notes (Signed)
Frederick Schultz   [id number]  1981-04-25  12/29/2020    Chief Complaint   Patient presents with   . Psychotherapy        Time in:  1400              Time out: 1500    SUBJECTIVE:  Frederick Schultz presents to clinic today per established outpatient therapy treatment plan. Anees discussed the events that have occurred since our last session including his continued Depressive symptoms. We reviewed his therapeutic assignment from last session. He reports not completing it. We discussed his treatment goals and he was unable to identify one; he reports coming to therapy to appease his roommate. He was unable to identify goals or steps he is willing to take. This provider informed client that in order to continue with therapy services, he need to be able to identify 1-2 goals to work on.     OBJECTIVE:     Orientation: Fully oriented to person, place, time and situation.  Marland Kitchen     Appearance:  casually dressed.     Eye Contact:  good.     Behavior:  cooperative.     Attention:  good.     Speech:  normal rate and volume.     Motor:  no psychomotor retardation or agitation.     Mood:  anxious.     Affect:  appears anxious.     Thought Process:  goal oriented.     Thought Content:  no paranoia or delusions.     Suicidal Ideation:  none.     Homicidal Ideation:  none   Perception:  no hallucinations endorsed   Cognition:  concrete.     Insight:  fair.     Judgement:  fair.     Goals/wishes:  develop coping skills for anxiety.     Other objective findings:  none    ASSESSMENT:    MDD    PROCEDURE:  Used supportive dynamic therapy to encourage identification and expression of feeling states.  Reinforced patient's self-efficacy.  Encouraged increased understanding and acceptance of feelings.  Assisted patient in identifying unproductive defense mechanisms with more helpful adaptive coping strategies. Used Dialectical Behavior therapy to decrease emotion dysregulation, learn effective interpersonal relationship skills, and  increase crisis survival skills.     Inquired for suicidal ideation/homicidal ideation. Frederick Schultz does not endorse current SI or HI.  * Frederick Schultz has information and plan for safety and is willing to seek help if SI and/or HI  are present.    PLAN:    Only able to return to clinic if able to identify treatment goals and/or motivation for treatment. Awaiting message from client. We discussed a transfer of care as well.     Frederick Schultz, LPC, 12/31/2020, 12:49

## 2021-01-05 ENCOUNTER — Encounter (HOSPITAL_BASED_OUTPATIENT_CLINIC_OR_DEPARTMENT_OTHER): Payer: Self-pay | Admitting: Family Medicine

## 2021-01-05 ENCOUNTER — Other Ambulatory Visit: Payer: Self-pay

## 2021-01-05 ENCOUNTER — Ambulatory Visit: Payer: Medicaid Other | Attending: Family Medicine | Admitting: Family Medicine

## 2021-01-05 VITALS — BP 104/70 | HR 109 | Temp 98.8°F | Ht 65.0 in | Wt 216.5 lb

## 2021-01-05 DIAGNOSIS — N1831 Chronic kidney disease, stage 3a: Secondary | ICD-10-CM | POA: Insufficient documentation

## 2021-01-05 DIAGNOSIS — Z6836 Body mass index (BMI) 36.0-36.9, adult: Secondary | ICD-10-CM

## 2021-01-05 DIAGNOSIS — G2581 Restless legs syndrome: Secondary | ICD-10-CM | POA: Insufficient documentation

## 2021-01-05 DIAGNOSIS — M25512 Pain in left shoulder: Secondary | ICD-10-CM | POA: Insufficient documentation

## 2021-01-05 DIAGNOSIS — F39 Unspecified mood [affective] disorder: Secondary | ICD-10-CM | POA: Insufficient documentation

## 2021-01-05 DIAGNOSIS — I129 Hypertensive chronic kidney disease with stage 1 through stage 4 chronic kidney disease, or unspecified chronic kidney disease: Secondary | ICD-10-CM | POA: Insufficient documentation

## 2021-01-05 DIAGNOSIS — I10 Essential (primary) hypertension: Secondary | ICD-10-CM

## 2021-01-05 DIAGNOSIS — N4889 Other specified disorders of penis: Secondary | ICD-10-CM | POA: Insufficient documentation

## 2021-01-05 MED ORDER — ROPINIROLE 0.25 MG TABLET
0.2500 mg | ORAL_TABLET | Freq: Every evening | ORAL | 5 refills | Status: DC
Start: 2021-01-05 — End: 2021-04-10

## 2021-01-05 NOTE — Progress Notes (Signed)
Family Medicine, [facility]  [address]  [place] [address]  [phone]     Clinical Progress Note        Frederick Schultz  Date of Service: 01/05/2021    Chief complaint:   Chief Complaint   Patient presents with   . Lab Results       Subjective:    39 y.o.male with a past medical history of HTN, mood disorder (GAD and MDD) and chronic back pain presents in follow up for labs, shoulder pain, and penile cyst.    Shoulder still stiff and making popping sounds. He was able to review Xrays which were negative.    In regards to his penile nodules, some have gone away from previous visits while other lesions are new. There was no antecedent event. One of them bust while he was sleeping and there was blood under the skin.     He is still having trouble sleeping. He does find that the diazepam is helpful for sleep when he does take it. Had adverse drug reaction to trazodone, did not tolerate mirtazapine and does not want to start Ambien due to high risk adverse side effects.      Medications:  Outpatient Medications Marked as Taking for the 01/05/21 encounter (Office Visit) with Leda Roys, DO   Medication Sig   . amLODIPine (NORVASC) 10 mg Oral Tablet Take 1 Tablet (10 mg total) by mouth Once a day   . diclofenac sodium (VOLTAREN) 1 % Gel by Apply Topically route Four times a day - before meals and bedtime   . hydrOXYzine pamoate (VISTARIL) 50 mg Oral Capsule Take 1 Capsule (50 mg total) by mouth Every night as needed for Other (insomnia)   . naproxen (NAPROSYN) 500 mg Oral Tablet Take 1 Tab (500 mg total) by mouth Every 12 hours as needed for Pain   . pantoprazole (PROTONIX) 40 mg Oral Tablet, Delayed Release (E.C.) Take 1 Tablet (40 mg total) by mouth Once a day   . PARoxetine (PAXIL) 30 mg Oral Tablet Take 2 Tablets (60 mg total) by mouth Every evening   . propranoloL (INDERAL) 10 mg Oral Tablet Take 1 Tablet (10 mg total) by mouth Twice daily   .  triamterene-hydroCHLOROthiazide (MAXZIDE) 75-50 mg Oral Tablet Take 1 Tablet by mouth Once a day          Allergies:  Allergies   Allergen Reactions   . Asa Buff (Mag Carb-Al Glyc) Eilene Ghazi, Buffered]        Medical History:  Past Medical History:   Diagnosis Date   . Anxiety    . Asthma    . Bipolar 1 disorder (CMS HCC)    . Depression            Objective:  Vitals: BP 104/70 (Site: Left, Patient Position: Sitting, Cuff Size: Adult)   Pulse (!) 109   Temp 37.1 C (98.8 F) (Thermal Scan)   Ht 1.651 m (5\' 5" )   Wt 98.2 kg (216 lb 7.9 oz)   SpO2 98%   BMI 36.03 kg/m     General: Well developed, well nourished. No acute distress  HEENT: Normocephalic, atraumatic. Extra occular movements intact bilaterally  Heart: Regular rate and rhythm, no appreciable murmurs, rubs, or gallops  Lungs: Clear to ascultation bilaterally, no appreciable wheezes, rales, or rhonchi  Abdomen: Bowel Sounds present in all 4 quadrants, soft, non-tender, non-distended, no appreciable rebound, guarding, or rigidity  Extremities: No appreciable clubbing, cyanosis,  or edema  Psych: Awake, alert, pleasant, mood euthymic      Labs:   Component      Latest Ref Rng & Units 05/27/2018 05/09/2020 11/30/2020           4:48 PM  4:42 PM  5:14 PM   SODIUM      136 - 145 mmol/L 140 139 143   POTASSIUM      3.5 - 5.1 mmol/L 3.8 3.2 (L) 3.3 (L)   CHLORIDE      96 - 111 mmol/L 103 102 101   CARBON DIOXIDE      22 - 30 mmol/L 30 28 32 (H)   ANION GAP      4 - 13 mmol/L 7 9 10    CALCIUM      8.5 - 10.0 mg/dL 9.5 9.1 9.9   GLUCOSE      65 - 125 mg/dL 96 93   BUN      8 - 25 mg/dL 17 24 20    CREATININE      0.75 - 1.35 mg/dL 244 (H) (H) 0.10 (H)   BUN/CREAT RATIO      6 - 22 12 16 13    ESTIMATED GLOMERULAR FILTRATION RATE      >=60 mL/min/BSA 57 (L) 56 (L) 58 (L)     Component      Latest Ref Rng & Units 11/30/2020           8:40 PM   VITAMIN D, 25OH      30 - 100 ng/mL 42     Component      Latest Ref Rng & Units 11/30/2020           5:14 PM    ALBUMIN      3.5 - 5.0 g/dL  4.4   ALKALINE PHOSPHATASE      45 - 115 U/L 71   ALT (SGPT)      10 - 55 U/L 22   AST (SGOT)      8 - 45 U/L 19   BILIRUBIN, TOTAL      0.3 - 1.3 mg/dL 1.6 (H)   BILIRUBIN,CONJUGATED      0.1 - 0.4 mg/dL 0.5 (H)   TOTAL PROTEIN      6.4 - 8.3 g/dL 7.5     Component      Latest Ref Rng & Units 11/30/2020           5:14 PM   IRON BINDING CAPACITY      252 - 504 ug/dL 12/02/2020   IRON SATURATION      15 - 50 % 36   IRON      55 - 175 ug/dL 12/02/2020   TRANSFERRIN      180 - 360 mg/dL 12/02/2020     Component      Latest Ref Rng & Units 11/30/2020 11/30/2020           5:14 PM  5:14 PM   HIV SCREEN, COMBINED ANTIGEN & ANTIBODY      Negative  Negative   SYPHILIS TP ANTIBODIES      Non-reactive Non-reactive      Component      Latest Ref Rng & Units 11/30/2020           5:14 PM   HEPATITIS C ANTIBODY      Negative Negative       Assessment/Plan:  1. Acute pain of left shoulder   -- Persistent  --  REFER to Physical Therapy     2. Penile cyst   -- Stable   -- Plan watchful waiting. Consider [place] if persistent next visit     3. Stage 3a chronic kidney disease (CMS HCC)  -- Etiology unclear.   -- REFER to Nephrology      4. Essential hypertension   -- Well controlled.  -- Cont amlodipine 10mg  daily   -- Cont triamterene-HCTZ 75-50mg  daily     5. Mood disorder (CMS HCC)   -- Cont to follow with Behavioral Medicine. Will differ sleep management to them  -- Cont paroxetine 40mg  daily  -- Cont propranolol 10mg  BID  -- Cont hydroxyzine 50mg  QHS prn     6. RLS (restless legs syndrome)   -- START ropinirole 0.25mg  QHS (may inc 2 tabs nightly)       Return in about 3 months (around 04/06/2021) for recheck RLS and CKD.      Madisyn Mawhinney B. , DO

## 2021-01-17 ENCOUNTER — Other Ambulatory Visit (HOSPITAL_BASED_OUTPATIENT_CLINIC_OR_DEPARTMENT_OTHER): Payer: Self-pay | Admitting: Family Medicine

## 2021-01-23 ENCOUNTER — Ambulatory Visit (HOSPITAL_BASED_OUTPATIENT_CLINIC_OR_DEPARTMENT_OTHER): Payer: Medicaid Other

## 2021-01-23 ENCOUNTER — Ambulatory Visit: Payer: Medicaid Other | Attending: Family Medicine | Admitting: NEPHROLOGY

## 2021-01-23 DIAGNOSIS — Z6834 Body mass index (BMI) 34.0-34.9, adult: Secondary | ICD-10-CM

## 2021-01-23 DIAGNOSIS — N1831 Chronic kidney disease, stage 3a: Secondary | ICD-10-CM | POA: Insufficient documentation

## 2021-01-29 ENCOUNTER — Encounter (HOSPITAL_BASED_OUTPATIENT_CLINIC_OR_DEPARTMENT_OTHER): Payer: Self-pay | Admitting: Family Medicine

## 2021-01-30 ENCOUNTER — Other Ambulatory Visit: Payer: Self-pay

## 2021-01-30 ENCOUNTER — Ambulatory Visit
Admission: RE | Admit: 2021-01-30 | Discharge: 2021-01-30 | Disposition: A | Discharge status: Home / Self Care | Payer: Medicaid Other | Source: Ambulatory Visit | Attending: NEPHROLOGY | Admitting: NEPHROLOGY

## 2021-01-30 ENCOUNTER — Ambulatory Visit (HOSPITAL_BASED_OUTPATIENT_CLINIC_OR_DEPARTMENT_OTHER): Payer: Medicaid Other

## 2021-01-30 DIAGNOSIS — N1831 Chronic kidney disease, stage 3a (CMS HCC): Secondary | ICD-10-CM

## 2021-01-30 DIAGNOSIS — D7389 Other diseases of spleen: Secondary | ICD-10-CM

## 2021-01-30 LAB — RENAL FUNCTION PANEL
ALBUMIN: 4.7 g/dL (ref 3.5–5.0)
ANION GAP: 12 mmol/L (ref 4–13)
BUN/CREA RATIO: 15 (ref 6–22)
BUN: 21 mg/dL (ref 8–25)
CALCIUM: 9.9 mg/dL (ref 8.5–10.0)
CHLORIDE: 101 mmol/L (ref 96–111)
CO2 TOTAL: 28 mmol/L (ref 22–30)
CREATININE: 1.38 mg/dL — ABNORMAL HIGH (ref 0.75–1.35)
ESTIMATED GFR: 64 mL/min/BSA (ref >=60)
GLUCOSE: 86 mg/dL (ref 65–125)
PHOSPHORUS: 2.7 mg/dL (ref 2.4–4.7)
POTASSIUM: 3.9 mmol/L (ref 3.5–5.1)
SODIUM: 141 mmol/L (ref 136–145)

## 2021-01-30 NOTE — Progress Notes (Signed)
Chart was opened in anticipation of clinic visit. Pt no-showed.

## 2021-02-04 LAB — ALDOSTERONE/PLASMA RENIN ACTIVITY RATIO, LC/MS/MS
ALDO/PRA RATIO: 4.5 Ratio (ref 0.9–28.9)
ALDOSTERONE: 23 ng/dL
PRA,LC/MS/MS: 5.09 ng/mL/h (ref 0.25–5.82)

## 2021-02-16 ENCOUNTER — Ambulatory Visit (INDEPENDENT_AMBULATORY_CARE_PROVIDER_SITE_OTHER): Payer: Medicaid Other | Admitting: Student in an Organized Health Care Education/Training Program

## 2021-02-16 ENCOUNTER — Other Ambulatory Visit: Payer: Self-pay

## 2021-02-16 DIAGNOSIS — F331 Major depressive disorder, recurrent, moderate: Secondary | ICD-10-CM

## 2021-02-16 DIAGNOSIS — F411 Generalized anxiety disorder: Secondary | ICD-10-CM

## 2021-02-16 DIAGNOSIS — F1021 Alcohol dependence, in remission: Secondary | ICD-10-CM

## 2021-02-16 MED ORDER — METOPROLOL SUCCINATE ER 50 MG TABLET,EXTENDED RELEASE 24 HR
50.0000 mg | ORAL_TABLET | Freq: Every day | ORAL | 2 refills | Status: DC
Start: 2021-02-16 — End: 2021-05-19

## 2021-02-16 NOTE — Progress Notes (Signed)
BE[facility]AV[facility]AL [facility]ICINE, [place]  [address]  [facility] [place] [address]    IN PERSON VISIT      Name: Frederick Schultz  MRN: [id number]    Date: 02/16/2021  Age: 40 y.o.       Chief complaint: Med check    Subjective:  Frederick Schultz is a 40 y.o. male presenting for medication management for MDD and anxiety.      Patient indicates that mood and anxiety have improved since last visit.  He indicates that he has been "less irritable" and better able to "let things go" than he had been.  Reports he was going to therapy but was told not to schedule for next appointment until he has a goal in mind to work towards.  Patient indicates that he would like to have a more consistent sleep schedule, but when asked about larger goals and things to work towards patient expresses that he is content with the way things are. Patient identifies problems with sleep initiation and sleep maintenance as an ongoing problem.  Indicates sleeping longer when taking his hydroxyzine but indicates he does not due this very often and that it has a delayed but prolonged effect compared to what he would prefer.  Reports this has been the case since childhood.  Environmental factors and behaviors regarding sleep discussed.  Patient reports tendency toward going to bed around 4-6AM with variable sleep durations.  Endorses periods of reduced sleep resulting feeling more tired.  Patient denies anhedonia and indicates that he enjoys spending his time doing streaming online and playing games online with others.      Regarding substances reports hx of excessive alcohol consumption and reports he has a "history of alcoholism" with numerous negative consequences on academics and interpersonal relationships in the past.  Reports he does not drink anymore.  Reports past THC use but denies recent/current.  Reports RLS sx ongoing for many years.  Reports PCP started him on Requip for this.  Reports tendency to only take propranolol once a day as he rarely  remembers other dose.  No SI, HI, or AVH.    Side-effects/ROS:   Denies dizziness/lightheadedness  Denies changes in appetite  Denies nausea, vomiting, diarrhea  Denies constipation or dry eye    Collateral: EMR  Psych hx:  Attempted suicide at 10 and was hospitalized for 2 weeks for this.  Medication Trials: Has tried Prozac (made "hyper" per patient as a child), Lithium (felt effective but was taken off due to concerns for potential long term side effects - denies ever having side effects or elevated levels on this), Depakote, trazodone - took once (got priapism), Buspar ineffective, Wellbutrin - lack of efficacy; vistaril - perceived lack of efficacy; Zoloft 200 mg DAILY - RLS-type sensations; Cymbalta 60 mg DAILY (continued anxiety despite optimizing dose and had return of RLS type phenomena); Lexapro 20 mg DAILY (mood and anxiety remained suboptimally controlled); Paxil 60 mg HS (noted improvement when dose increased from 40 mg to 60 mg)  Medical hx:  Back pain; HTN  Social:  Primarily stays at home.  Lives with a roommate.  Some college, but reports his extent of alcohol consumption had pronounced negative impact on his grades resulting in dropping out.  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  Not employed at this time.  Patient had  attempted to get on disability unsuccessfully.  He indicates he does not intent to seek employment now or in the future.       Outpatient medications:    Current Outpatient Medications   Medication Sig    amLODIPine (NORVASC) 10 mg Oral Tablet Take 1 Tablet (10 mg total) by mouth Once a day    diclofenac sodium (VOLTAREN) 1 % Gel by Apply Topically route Four times a day - before meals and bedtime    hydrOXYzine pamoate (VISTARIL) 50 mg Oral Capsule Take 1 Capsule (50 mg total) by mouth Every night as needed for Other (insomnia)     naproxen (NAPROSYN) 500 mg Oral Tablet Take 1 Tab (500 mg total) by mouth Every 12 hours as needed for Pain    pantoprazole (PROTONIX) 40 mg Oral Tablet, Delayed Release (E.C.) Take 1 Tablet (40 mg total) by mouth Once a day    PARoxetine (PAXIL) 30 mg Oral Tablet Take 2 Tablets (60 mg total) by mouth Every evening    propranoloL (INDERAL) 10 mg Oral Tablet Take 1 Tablet (10 mg total) by mouth Twice daily    rOPINIRole (REQUIP) 0.25 mg Oral Tablet Take 1 Tablet (0.25 mg total) by mouth Every night For 4 weeks. May take 2 tabs nightly after 52mo    triamterene-hydroCHLOROthiazide (MAXZIDE) 75-50 mg Oral Tablet Take 1 Tablet by mouth Once a day        Objective:  Physical Exam:   There were no vitals taken for this visit.  General:  appears in good health, appears stated age and vital signs reviewed  Eyes:  Conjunctiva clear; no nystagmus; no obvious EOM abnormalities  HENT:  Head is normocephalic, atraumatic   Neck:  Midline  Heart:  No cyanosis or JVD  Lungs:  Nonlabored breathing.    Abdomen:  Nondistended.  Protuberant.  Extremities:  No clubbing or edema on exposed skin  Neurologic:  Nontremulous.  Alert and grossly oriented.    Mental Status Exam:  Appearance:  casually dressed and appears actual age; wearing beanie cap.  Behavior:  cooperative and eye contact limited   Motor/MS:  normal gait; no tremor; tends to bounce knee/foot  Speech:  Normal rate and prosody  Mood:  "less irritable"  Affect:  Normal range; euthymic  Perception:  normal  Thought:  goal directed/coherent  Thought Content:  appropriate   Suicidal Ideation:  none.    Homicidal Ideation:  none  Level of Consciousness:  alert  Orientation:  grossly normal  Concentration:  good  Memory:  grossly normal  Conceptual:  abstract thinking  Judgement:  Fair to good  Insight:  Fair to good    Assessment/Transfer of care:  Frederick Schultz is a 40 y.o. male with MDD, GAD with elements of agoraphobia, and hx of AUD in SR presenting for med check.  Patient has  had numerous med trials in the past and notes current regimen has been beneficial for his depression, anxiety, and irritability and that these are improved, but not 100% resolved as he notes that he can get down or anxious for a time especially when left with his thoughts, but does not ruminate on his thoughts like he used to.  Patient reports lifelong problems with sleep.  Psychoeducation regarding sleep hygiene provided.  Patient had been in therapy, however this has been paused until patient identifies goals to work towards for which patient has indicated that he does not have goals nor behaviors he wishes to change.  Patient  had applied for disability previously which was ultimately denied.  He indicates he has worked in the past, but that he would get anxious and walk out during his shifts which was not conducive to remaining employed.  Patient identifies that it would be nice to have an income, however indicates he is not intending to pursue employment nor further training or education at this time.      Patient tends to only take his propranolol once a day and attempts to increase frequency have been unyielding.  Will therefore switch this medication to a longer acting medication.  Patient tolerating medications without side effects.  No acute safety concerns at this time.    Psychiatric Diagnoses: MDD, recurrent, in partial remission; Generalized anxiety disorder; alcohol use disorder, in sustained remission; RLS    Plan:    Discussed risks, benefits, and alternatives. Patient agreeable to plan as described below.   Medications:  Continue Paxil 60 mg HS for MDD and GAD  Discontinue propranolol due to limited compliance with 2nd dose.  Start metoprolol XL 50 mg DAILY - using off label for GAD due to longer duration of action with DAILY dosing  The risks and benefits and alternatives of beta blockers were discussed, including but not limited to: hypotension, lightheadedness, and dizziness.  Continue vistaril  50 mg HS-PRN for insomnia    Other:  PCP is treating RLS and medical comorbidities    Laboratory Studies:  Most recent labs reviewed, No new labs needed at this time     Therapy:  Was seeing Levonne Hubert, [facility] for therapy - however this has been paused until patient identifies treatment goals he would like to work towards.  CBT-i (CBT for insomnia) treatment discussed as a potential intervention, however patient not interested in pursuing this at this time.  Motivational interviewing integrated into visit.  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     Follow up:  -  Follow up TOC to Dr. Darreld Mclean in 2 months.  Discussed TOC to another resident due to this provider's completion of PGY4 and therefore residency.  - Patient advised to call the call center or send MyChart message with any questions or concerns.       Ferd Hibbs, MD  02/16/2021    Late Entry for 02/16/2021.  I saw and examined the patient in person at [facility] from 16:15 to 16:20 for a total of 5 minutes, along with the resident.  I was present and participated in the development of the treatment plan. I reviewed the resident's note. I agree with the findings and plan of care, as documented in the resident's note.  Any exceptions/ additions are edited/noted.    Sharrie Self N. Christena Deem, M.D.   Staff Psychiatrist  Frederick Creer Marcille Buffy, MD  02/20/2021, 16:18

## 2021-02-20 ENCOUNTER — Encounter (HOSPITAL_BASED_OUTPATIENT_CLINIC_OR_DEPARTMENT_OTHER): Payer: Self-pay | Admitting: Family Medicine

## 2021-02-20 ENCOUNTER — Telehealth (HOSPITAL_COMMUNITY): Payer: Self-pay

## 2021-02-20 NOTE — Telephone Encounter (Signed)
COVID screening questions reviewed. Reviewed pre procedure instructions, current COVID guidelines and visitation policies. All questions answered. For any questions regarding stress testing please call [phone]

## 2021-02-21 ENCOUNTER — Other Ambulatory Visit (HOSPITAL_COMMUNITY): Payer: Self-pay

## 2021-03-19 ENCOUNTER — Ambulatory Visit (INDEPENDENT_AMBULATORY_CARE_PROVIDER_SITE_OTHER): Payer: Medicaid Other

## 2021-03-19 ENCOUNTER — Encounter (INDEPENDENT_AMBULATORY_CARE_PROVIDER_SITE_OTHER): Payer: Self-pay

## 2021-03-19 VITALS — BP 118/79 | HR 97 | Temp 98.0°F | Resp 18 | Ht 66.0 in | Wt 215.0 lb

## 2021-03-19 DIAGNOSIS — M79674 Pain in right toe(s): Secondary | ICD-10-CM

## 2021-03-19 DIAGNOSIS — N489 Disorder of penis, unspecified: Secondary | ICD-10-CM

## 2021-03-19 DIAGNOSIS — Z6834 Body mass index (BMI) 34.0-34.9, adult: Secondary | ICD-10-CM

## 2021-03-19 DIAGNOSIS — M10071 Idiopathic gout, right ankle and foot: Secondary | ICD-10-CM

## 2021-03-19 MED ORDER — PREDNISONE 20 MG TABLET
ORAL_TABLET | ORAL | 0 refills | Status: AC
Start: 2021-03-19 — End: 2021-04-04

## 2021-03-19 MED ORDER — KETOROLAC 15 MG/ML INJECTION SOLUTION
15.0000 mg | Freq: Once | INTRAMUSCULAR | Status: DC
Start: 2021-03-19 — End: 2021-03-19

## 2021-03-19 MED ORDER — KETOROLAC 30 MG/ML (1 ML) INJECTION SOLUTION
15.0000 mg | Freq: Once | INTRAMUSCULAR | Status: AC
Start: 2021-03-19 — End: 2021-03-19
  Administered 2021-03-19: 15 mg via INTRAMUSCULAR
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
## 2021-03-22 ENCOUNTER — Telehealth (HOSPITAL_BASED_OUTPATIENT_CLINIC_OR_DEPARTMENT_OTHER): Payer: Self-pay | Admitting: NEPHROLOGY

## 2021-03-22 NOTE — Telephone Encounter (Signed)
Dr. Suzy Bouchard  Patient no showed for the abdominal duplex renal  that you ordered.   Frederick Schultz  03/22/2021, 08:38

## 2021-04-10 ENCOUNTER — Other Ambulatory Visit: Payer: Self-pay | Admitting: Sports Medicine

## 2021-04-10 ENCOUNTER — Other Ambulatory Visit: Payer: Self-pay

## 2021-04-10 ENCOUNTER — Ambulatory Visit: Payer: Medicaid Other | Attending: Family Medicine | Admitting: Family Medicine

## 2021-04-10 ENCOUNTER — Encounter (HOSPITAL_BASED_OUTPATIENT_CLINIC_OR_DEPARTMENT_OTHER): Payer: Self-pay | Admitting: Family Medicine

## 2021-04-10 VITALS — BP 112/70 | HR 90 | Temp 99.1°F | Resp 20 | Ht 66.0 in | Wt 219.8 lb

## 2021-04-10 DIAGNOSIS — M25532 Pain in left wrist: Secondary | ICD-10-CM

## 2021-04-10 DIAGNOSIS — I10 Essential (primary) hypertension: Secondary | ICD-10-CM | POA: Insufficient documentation

## 2021-04-10 DIAGNOSIS — Z6835 Body mass index (BMI) 35.0-35.9, adult: Secondary | ICD-10-CM

## 2021-04-10 DIAGNOSIS — G2581 Restless legs syndrome: Secondary | ICD-10-CM | POA: Insufficient documentation

## 2021-04-10 DIAGNOSIS — N4889 Other specified disorders of penis: Secondary | ICD-10-CM | POA: Insufficient documentation

## 2021-04-10 MED ORDER — ROPINIROLE 1 MG TABLET
1.0000 mg | ORAL_TABLET | Freq: Every evening | ORAL | 3 refills | Status: DC
Start: 2021-04-10 — End: 2021-10-27

## 2021-04-10 NOTE — Progress Notes (Signed)
Family Medicine, [facility]  [address]  [place] [address]  [phone]     Clinical Progress Note        Frederick Schultz  Date of Service: 04/24/2021    Chief complaint:   Chief Complaint   Patient presents with   . Follow Up 3 Months       Subjective:    39 y.o.male with a past medical history of HTN, CKD III, mood disorder (GAD and MDD) and chronic back pain presents in follow up HTN, sleep, and penile cyst.    In regards to his RLS, symptoms are better. Currently taking ropinirole 0.5mg  nightly. Does think there is room for improvement.     He recently had a gout flare July 2022. He went to Urgent Care for evaluation and given prednisone. This treatment improved his pain.    In regards to his penile cysts, these cysts have also completely resolved. Coincidently, they got better with the prednisone above treatment.      Medications:  Outpatient Medications Marked as Taking for the 04/10/21 encounter (Office Visit) with Leda Roys, DO   Medication Sig   . amLODIPine (NORVASC) 10 mg Oral Tablet Take 1 Tablet (10 mg total) by mouth Once a day   . metoprolol succinate (TOPROL-XL) 50 mg Oral Tablet Sustained Release 24 hr Take 1 Tablet (50 mg total) by mouth Once a day   . PARoxetine (PAXIL) 30 mg Oral Tablet Take 2 Tablets (60 mg total) by mouth Every evening   . rOPINIRole (REQUIP) 1 mg Oral Tablet Take 1 Tablet (1 mg total) by mouth Every night For 4 weeks. May take 2 tabs nightly after 64mo   . triamterene-hydroCHLOROthiazide (MAXZIDE) 75-50 mg Oral Tablet Take 1 Tablet by mouth Once a day          Allergies:  Allergies   Allergen Reactions   . Trazodone  Other Adverse Reaction (Add comment)     Priapism   . Asa Buff (Mag Carb-Al Glyc) Eilene Ghazi, Buffered]        Medical History:  Past Medical History:   Diagnosis Date   . Anxiety    . Asthma    . Bipolar 1 disorder (CMS HCC)    . Depression            Objective:  Vitals: BP 112/70 (Site: Right, Patient Position:  Sitting, Cuff Size: Adult)   Pulse 90   Temp 37.3 C (99.1 F) (Thermal Scan)   Resp 20   Ht 1.676 m (5\' 6" )   Wt 99.7 kg (219 lb 12.8 oz)   SpO2 99%   BMI 35.48 kg/m     General: Well developed, well nourished. No acute distress  HEENT: Normocephalic, atraumatic. Extra occular movements intact bilaterally  Heart: Regular rate and rhythm, no appreciable murmurs, rubs, or gallops  Lungs: Clear to ascultation bilaterally, no appreciable wheezes, rales, or rhonchi  Psych: Awake, alert, pleasant, mood euthymic      Assessment/Plan:  1. RLS (restless legs syndrome)   -- Improved but not resolved  -- INC and REFILL ropinirole 1mg  nightly      2. Penile cyst   -- Resolved. Interesting that these cysts resolved with prednisone. Unclear if they are related to gout given he was not having gout pain when the lesions appeared     3. Essential hypertension   -- Well controlled.  -- Cont amlodipine 10mg  daily   -- Cont triamterene-HCTZ 75-50mg  daily  Return in about 6 months (around 10/11/2021) for recheck HTN and RLS.      [name], DO

## 2021-04-13 ENCOUNTER — Ambulatory Visit: Payer: Medicaid Other | Attending: INTERNAL MEDICINE | Admitting: Specialist

## 2021-04-13 ENCOUNTER — Other Ambulatory Visit: Payer: Self-pay

## 2021-04-13 ENCOUNTER — Encounter (INDEPENDENT_AMBULATORY_CARE_PROVIDER_SITE_OTHER): Payer: Self-pay | Admitting: Urology

## 2021-04-13 DIAGNOSIS — Z6835 Body mass index (BMI) 35.0-35.9, adult: Secondary | ICD-10-CM

## 2021-04-13 DIAGNOSIS — N489 Disorder of penis, unspecified: Secondary | ICD-10-CM | POA: Insufficient documentation

## 2021-04-13 DIAGNOSIS — Z87438 Personal history of other diseases of male genital organs: Secondary | ICD-10-CM

## 2021-04-13 NOTE — Progress Notes (Signed)
NAME :Frederick Schultz  AGE: 40 y.o.  DATE: 04/13/2021  SERVICE: Urology        Chief Complaint   Patient presents with   . Other     penile lesion       HPI:  This is a 40 y.o. male seen in consultation for penile lesions.  He noticed several "cysts" on his penis that developed without precipitating event in February.  He had no pain associated with these.  There is minimal redness but no other concern for infection.  He recently took a course of steroids.  He says that these cysts all resolved around the time of the course of steroids.  He is asymptomatic and has no other urologic complaints.      Past Medical History  Past Medical History:   Diagnosis Date   . Anxiety    . Asthma    . Bipolar 1 disorder (CMS HCC)    . Depression            Past Surgical History  Past Surgical History:   Procedure Laterality Date   . ABDOMINAL HERNIA REPAIR               Allergies  Allergies   Allergen Reactions   . Trazodone  Other Adverse Reaction (Add comment)     Priapism   . Asa Buff (Mag Carb-Al Glyc) Eilene Ghazi, Buffered]          Medications  amLODIPine (NORVASC) 10 mg Oral Tablet, Take 1 Tablet (10 mg total) by mouth Once a day  diclofenac sodium (VOLTAREN) 1 % Gel, by Apply Topically route Four times a day - before meals and bedtime (Patient not taking: Reported on 04/13/2021)  hydrOXYzine pamoate (VISTARIL) 50 mg Oral Capsule, Take 1 Capsule (50 mg total) by mouth Every night as needed for Other (insomnia)  metoprolol succinate (TOPROL-XL) 50 mg Oral Tablet Sustained Release 24 hr, Take 1 Tablet (50 mg total) by mouth Once a day  PARoxetine (PAXIL) 30 mg Oral Tablet, Take 2 Tablets (60 mg total) by mouth Every evening  rOPINIRole (REQUIP) 1 mg Oral Tablet, Take 1 Tablet (1 mg total) by mouth Every night For 4 weeks. May take 2 tabs nightly after 26mo  triamterene-hydroCHLOROthiazide (MAXZIDE) 75-50 mg Oral Tablet, Take 1 Tablet by mouth Once a day    No facility-administered medications prior to visit.      Past Social  History  He is self-employed.  He denies tobacco or alcohol use.    Family History   Significant for hypertension in both parents    ROS:  Please see HPI.  All other systems were reviewed by me and are negative.                OBJECTIVE:  PHYSICAL EXAM:    BP 112/80   Pulse 76   Temp 36.9 C (98.4 F) (Thermal Scan)   Ht 1.676 m (5\' 6" )   Wt 101 kg (222 lb 0.1 oz)   SpO2 98%   BMI 35.83 kg/m       General:  NAD.  Vital signs reviewed.  Skin:  Warm, dry, and without suspicious lesion.    HEENT:  Head is normocephalic.  Hearing adequate for conversation  Pulmonary:  Respiration unlabored.  No audible wheeze.     Cardiovascular:  No JVD.    MS:  Ambulates without difficulty.  No CVAT.   GI: The abdomen is soft, nontender and nondistended. No palpable hernia.  GU:  Phallus unremarkable.  Urinary meatus patent.  The previous areas of concern have completely resolved.  Scrotum is unremarkable.  Testicles are descended bilaterally, they are nontender and without suspicious nodules.  Cord structures and epididymis unremarkable.  Digital rectal exam was not indicated.      LABS:  01/30/2021-creatinine 1.38    Urine Dip Results:  Time collected: 0956  Glucose (Ref Range: Negative mg/dL): Negative  Bilirubin (Ref Range: Negative mg/dL): Negative  Ketones (Ref Range: Negative mg/dL): Negative  Urine Specific Gravity (Ref Range: 1.005 - 1.030): 1.010  Blood (urine) (Ref Range: Negative mg/dL): Negative  pH (Ref Range: 5.0 - 8.0): 6.5  Protein (Ref Range: Negative mg/dL): Negative  Urobilinogen (Ref Range: Negative mg/dL): Normal   Nitrite (Ref Range: Negative): Negative  Leukocytes (Ref Range: Negative WBC's/uL): Negative      DIAGNOSTIC STUDIES:  Frederick Schultz  Male, 40 years old.    US KIDNEY performed on 01/30/2021 1:19 PM.    REASON FOR EXAM:  N18.31: Stage 3a chronic kidney disease (CMS HCC)    COMPARISON:  No relevant priors available for comparison    FINDINGS:   RIGHT: The right kidney measures  9.92 cm x 3.65  cm. Renal parenchymal thickness and echogenicity are preserved. There is no hydronephrosis.     LEFT: The left kidney measures 10.24 cm x 4.11 cm. Renal parenchymal thickness and echogenicity are preserved. There is no hydronephrosis.     Doppler imaging confirms blood flow to both kidneys.    Probable splenule seen at the splenic hilum.    The calculated bladder volume is 41 mL. The partially distended urinary bladder is not well evaluated.    IMPRESSION:  1. Normal sonographic evaluation of the bilateral kidneys.  2. Probable splenule seen at the splenic hilum.  3. Partially distended urinary bladder is not well evaluated.       Linked Documents    View Report                 This study was interpreted by: Dicie Beam, MD   This report has been reviewed and released by: Signed by: Dicie Beam, MD on 01/30/2021  1:37 PM       ASSESSMENT:   Resolved penile lesions      PLAN:  He will follow with [place] on as-needed basis.    Orders Placed This Encounter   . POCT URINE DIPSTICK         Boyd Kerbs, APRN,FNP-BC  04/13/2021, 10:57    Boyd Kerbs, APRN  [facility]  Department of Urology  Phone- [phone]  Fax- [phone]

## 2021-04-21 ENCOUNTER — Encounter (HOSPITAL_COMMUNITY): Payer: Self-pay

## 2021-04-21 ENCOUNTER — Ambulatory Visit (INDEPENDENT_AMBULATORY_CARE_PROVIDER_SITE_OTHER): Payer: Medicaid Other | Admitting: Student in an Organized Health Care Education/Training Program

## 2021-04-21 DIAGNOSIS — F1021 Alcohol dependence, in remission: Secondary | ICD-10-CM

## 2021-04-21 DIAGNOSIS — Z029 Encounter for administrative examinations, unspecified: Secondary | ICD-10-CM

## 2021-04-21 DIAGNOSIS — F411 Generalized anxiety disorder: Secondary | ICD-10-CM

## 2021-04-21 DIAGNOSIS — F331 Major depressive disorder, recurrent, moderate: Secondary | ICD-10-CM

## 2021-04-21 NOTE — Progress Notes (Signed)
vThe patient did not appear for their appointment/or scheduled appointment was cancelled.  This office visit opened in error.      Darreld Mclean, DO  04/21/2021, 14:35   Behavioral Medicine & Psychiatry  Resident, PGY-3

## 2021-04-24 ENCOUNTER — Encounter (HOSPITAL_BASED_OUTPATIENT_CLINIC_OR_DEPARTMENT_OTHER): Payer: Self-pay | Admitting: Family Medicine

## 2021-05-03 ENCOUNTER — Inpatient Hospital Stay: Admission: RE | Admit: 2021-05-03 | Payer: Self-pay | Source: Ambulatory Visit

## 2021-05-03 ENCOUNTER — Other Ambulatory Visit: Payer: Self-pay

## 2021-05-17 ENCOUNTER — Other Ambulatory Visit (HOSPITAL_COMMUNITY): Payer: Self-pay | Admitting: Student in an Organized Health Care Education/Training Program

## 2021-05-25 ENCOUNTER — Other Ambulatory Visit (HOSPITAL_BASED_OUTPATIENT_CLINIC_OR_DEPARTMENT_OTHER): Payer: Self-pay | Admitting: Family Medicine

## 2021-05-26 ENCOUNTER — Ambulatory Visit
Admission: RE | Admit: 2021-05-26 | Discharge: 2021-05-26 | Disposition: A | Discharge status: Home / Self Care | Payer: Self-pay | Source: Ambulatory Visit | Attending: Sports Medicine | Admitting: Sports Medicine

## 2021-05-26 ENCOUNTER — Other Ambulatory Visit: Payer: Self-pay

## 2021-05-26 DIAGNOSIS — M25532 Pain in left wrist: Secondary | ICD-10-CM

## 2021-05-26 MED ORDER — IOPAMIDOL (ISOVUE-M 200) INJECTION 41%
1.0000 mL | Freq: Once | INTRAMUSCULAR | Status: AC
  Administered 2021-05-26: 1 mL via INTRA_ARTICULAR

## 2021-05-29 ENCOUNTER — Encounter (HOSPITAL_BASED_OUTPATIENT_CLINIC_OR_DEPARTMENT_OTHER): Payer: Self-pay | Admitting: Family Medicine

## 2021-06-16 ENCOUNTER — Other Ambulatory Visit (HOSPITAL_COMMUNITY): Payer: Self-pay | Admitting: Student in an Organized Health Care Education/Training Program

## 2021-06-30 ENCOUNTER — Encounter (HOSPITAL_COMMUNITY): Payer: Self-pay

## 2021-06-30 ENCOUNTER — Ambulatory Visit (INDEPENDENT_AMBULATORY_CARE_PROVIDER_SITE_OTHER): Payer: Medicaid Other | Admitting: Student in an Organized Health Care Education/Training Program

## 2021-06-30 DIAGNOSIS — Z029 Encounter for administrative examinations, unspecified: Secondary | ICD-10-CM

## 2021-06-30 NOTE — Progress Notes (Signed)
The patient did not appear for their appointment/or scheduled appointment was cancelled.  This office visit opened in error.      Darreld Mclean, DO  06/30/2021, 14:56   Behavioral Medicine & Psychiatry  Resident, PGY-3      I reviewed the resident's note.  I agree with the findings and plan of care as documented. I did not directly see this patient today.    Dorothey Baseman, MD  06/30/2021, 15:03

## 2021-06-30 NOTE — Progress Notes (Deleted)
BEHAVIORAL MEDICINE, [place]  [address]  [facility] [place] [address]    IN PERSON VISIT      Name: Frederick Schultz  MRN: [id number]    Date: 06/30/2021  Age: 40 y.o.       Chief complaint: Med check    Subjective:  Frederick Schultz is a 40 y.o. male presenting for medication management for MDD and anxiety. Last seen by Dr. Arville Care 02/2021, at that time inderal d/c'd and replaced with metoprolol 50mg  daily (longer duration of action/qd dosing/limited compliance with 2nd dose of inderal).1 no show in 04/2021 since that visit.     Since last visit: *** he she the  - Therapy  - Life goals: ***    Re Stressors: ***    Re Meds and side effects: ***  - Metoprolol-     Re Mood: ***    Re anxiety: ***    Re Sleep: ***    Re EtOH/cannabis: ***    Re SI/HI/AVH: ***    Re physical activity: ***    Side-effects/ROS:   Denies dizziness/lightheadedness  Denies changes in appetite  Denies nausea, vomiting, diarrhea  Denies constipation or dry eye        02/2021 Subjective:  Patient indicates that mood and anxiety have improved since last visit.  He indicates that he has been "less irritable" and better able to "let things go" than he had been.  Reports he was going to therapy but was told not to schedule for next appointment until he has a goal in mind to work towards.  Patient indicates that he would like to have a more consistent sleep schedule, but when asked about larger goals and things to work towards patient expresses that he is content with the way things are. Patient identifies problems with sleep initiation and sleep maintenance as an ongoing problem.  Indicates sleeping longer when taking his hydroxyzine but indicates he does not due this very often and that it has a delayed but prolonged effect compared to what he would prefer.  Reports this has been the case since childhood.  Environmental factors and behaviors regarding sleep discussed.  Patient reports tendency toward going to bed around 4-6AM with variable sleep  durations.  Endorses periods of reduced sleep resulting feeling more tired.  Patient denies anhedonia and indicates that he enjoys spending his time doing streaming online and playing games online with others.      Regarding substances reports hx of excessive alcohol consumption and reports he has a "history of alcoholism" with numerous negative consequences on academics and interpersonal relationships in the past.  Reports he does not drink anymore.  Reports past THC use but denies recent/current.  Reports RLS sx ongoing for many years.  Reports PCP started him on Requip for this.  Reports tendency to only take propranolol once a day as he rarely remembers other dose.  No SI, HI, or AVH.      ___________________________________________________________________    Collateral: EMR  Psych hx:  Attempted suicide at 1 and was hospitalized for 2 weeks for this.  Medication Trials: Has tried Prozac (made "hyper" per patient as a child), Lithium (felt effective but was taken off due to concerns for potential long term side effects - denies ever having side effects or elevated levels on this), Depakote, trazodone - took once (got priapism), Buspar ineffective, Wellbutrin - lack of efficacy; vistaril - perceived lack of efficacy; Zoloft 200 mg DAILY - RLS-type sensations; Cymbalta 60 mg  DAILY (continued anxiety despite optimizing dose and had return of RLS type phenomena); Lexapro 20 mg DAILY (mood and anxiety remained suboptimally controlled); Paxil 60 mg HS (noted improvement when dose increased from 40 mg to 60 mg)  Medical hx:  Back pain; HTN  Social:  Primarily stays at home.  Lives with a roommate.  Some college, but reports his extent of alcohol consumption had pronounced negative impact on his grades resulting in dropping out.  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  Not  employed at this time.  Patient had attempted to get on disability unsuccessfully.  He indicates he does not intent to seek employment now or in the future.       Outpatient medications:    Current Outpatient Medications   Medication Sig   . amLODIPine (NORVASC) 10 mg Oral Tablet Take 1 Tablet (10 mg total) by mouth Once a day   . diclofenac sodium (VOLTAREN) 1 % Gel by Apply Topically route Four times a day - before meals and bedtime (Patient not taking: Reported on 04/13/2021)   . hydrOXYzine pamoate (VISTARIL) 50 mg Oral Capsule Take 1 Capsule (50 mg total) by mouth Every night as needed for Other (insomnia)   . metoprolol succinate (TOPROL-XL) 50 mg Oral Tablet Sustained Release 24 hr Take 1 tablet by mouth once daily   . PARoxetine (PAXIL) 30 mg Oral Tablet TAKE 2 TABLETS BY MOUTH IN THE EVENING   . rOPINIRole (REQUIP) 1 mg Oral Tablet Take 1 Tablet (1 mg total) by mouth Every night For 4 weeks. May take 2 tabs nightly after 38mo   . triamterene-hydroCHLOROthiazide (MAXZIDE) 75-50 mg Oral Tablet Take 1 Tablet by mouth Once a day        Objective:  Physical Exam:   There were no vitals taken for this visit.   General:  appears in good health, appears stated age and vital signs reviewed   Eyes:  Conjunctiva clear; no nystagmus; no obvious EOM abnormalities   HENT:  Head is normocephalic, atraumatic    Neck:  Midline   Heart:  No cyanosis or JVD   Lungs:  Nonlabored breathing.     Abdomen:  Nondistended.  Protuberant.   Extremities:  No clubbing or edema on exposed skin   Neurologic:  Nontremulous.  Alert and grossly oriented.    Mental Status Exam:   Appearance:  casually dressed and appears actual age; wearing beanie cap.   Behavior:  cooperative and eye contact limited    Motor/MS:  normal gait; no tremor; tends to bounce knee/foot   Speech:  Normal rate and prosody   Mood:  "less irritable"   Affect:  Normal range; euthymic   Perception:  normal   Thought:  goal directed/coherent   Thought  Content:  appropriate    Suicidal Ideation:  none.     Homicidal Ideation:  none   Level of Consciousness:  alert   Orientation:  grossly normal   Concentration:  good   Memory:  grossly normal   Conceptual:  abstract thinking   Judgement:  Fair to good   Insight:  Fair to good    Assessment/Transfer of care:  Frederick Schultz is a 40 y.o. male with MDD, GAD with elements of agoraphobia, and hx of AUD in SR presenting for med check.  Patient has had numerous  med trials in the past and notes current regimen has been beneficial for his depression, anxiety, and irritability and that these are improved, but not 100% resolved as he notes that he can get down or anxious for a time especially when left with his thoughts, but does not ruminate on his thoughts like he used to.  Patient reports lifelong problems with sleep.  Psychoeducation regarding sleep hygiene provided.  Patient had been in therapy, however this has been paused until patient identifies goals to work towards for which patient has indicated that he does not have goals nor behaviors he wishes to change.  Patient had applied for disability previously which was ultimately denied.  He indicates he has worked in the past, but that he would get anxious and walk out during his shifts which was not conducive to remaining employed.  Patient identifies that it would be nice to have an income, however indicates he is not intending to pursue employment nor further training or education at this time.      Patient tends to only take his propranolol once a day and attempts to increase frequency have been unyielding.  Will therefore switch this medication to a longer acting medication.  Patient tolerating medications without side effects.  No acute safety concerns at this time.    Psychiatric Diagnoses: MDD, recurrent, in partial remission; Generalized anxiety disorder; alcohol use disorder, in sustained remission; RLS    Plan:    Discussed risks, benefits, and  alternatives. Patient agreeable to plan as described below.   Medications:  Continue Paxil 60 mg HS for MDD and GAD (increased 12/2020 from 40mg  for residual depression/anxiety, and sleep)  Discontinue propranolol 10mg  bid due to limited compliance with 2nd dose. (d/c'd 02/2021)  Start metoprolol XL 50 mg DAILY - using off label for GAD due to longer duration of action with DAILY dosing (started 02/2021)  The risks and benefits and alternatives of beta blockers were discussed, including but not limited to: hypotension, lightheadedness, and dizziness.  Continue vistaril 50 mg HS-PRN for insomnia    Other:  PCP is treating RLS and medical comorbidities    Laboratory Studies:  Most recent labs reviewed, No new labs needed at this time     Therapy:  Was seeing 03/2021, [facility] for therapy - however this has been paused until patient identifies treatment goals he would like to work towards.  CBT-i (CBT for insomnia) treatment discussed as a potential intervention, however patient not interested in pursuing this at this time.  Motivational interviewing integrated into visit.  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     Follow up:  -  Follow up  2 months.    - Patient advised to call the call center or send MyChart message with any questions or concerns.       Levonne Hubert, DO  06/30/2021, 09:05   Behavioral Medicine & Psychiatry  Resident, PGY-3

## 2021-07-17 ENCOUNTER — Other Ambulatory Visit (HOSPITAL_COMMUNITY): Payer: Self-pay | Admitting: Student in an Organized Health Care Education/Training Program

## 2021-07-18 ENCOUNTER — Encounter (HOSPITAL_COMMUNITY): Payer: Self-pay

## 2021-07-18 NOTE — Telephone Encounter (Signed)
MyChart message sent to patient with phone number to call and schedule appointment. No show policy included for review before next visit.  Patient did not show for appointments with Dr. Allena Katz on 04/21/21 and 06/30/21

## 2021-07-20 ENCOUNTER — Other Ambulatory Visit (HOSPITAL_COMMUNITY): Payer: Self-pay | Admitting: Student in an Organized Health Care Education/Training Program

## 2021-07-21 NOTE — Telephone Encounter (Signed)
Pt requests refills of Toprol-XL.  Last ordered    metoprolol succinate (TOPROL-XL) 50 mg Oral Tablet Sustained Release 24 hr 30 Tablet 1 05/19/2021     Sig: Take 1 tablet by mouth once daily    Sent to pharmacy as: metoprolol succinate ER 50 mg tablet,extended release 24 hr (TOPROL-XL)    Class: E-Rx    Non-formulary Exception Code: [id number]/No Formulary Info Available    E-Prescribing Status: Receipt confirmed by pharmacy (05/19/2021 9:29 AM EDT)          Next scheduled appt is    09/20/2021 8:30 AM Darreld Mclean, DO BEHAVIORAL MED-[facility] [id number] [facility]         Danzell Birky D. Geraldo Pitter, RN, OCN

## 2021-07-24 ENCOUNTER — Other Ambulatory Visit (HOSPITAL_COMMUNITY): Payer: Self-pay | Admitting: Student in an Organized Health Care Education/Training Program

## 2021-07-24 MED ORDER — PAROXETINE 30 MG TABLET
ORAL_TABLET | ORAL | 3 refills | Status: DC
Start: 2021-07-24 — End: 2021-09-20

## 2021-08-24 ENCOUNTER — Other Ambulatory Visit: Payer: Medicaid Other | Attending: NURSE PRACTITIONER, FAMILY | Admitting: NURSE PRACTITIONER, FAMILY

## 2021-08-24 ENCOUNTER — Ambulatory Visit (INDEPENDENT_AMBULATORY_CARE_PROVIDER_SITE_OTHER): Payer: Medicaid Other

## 2021-08-24 ENCOUNTER — Encounter (INDEPENDENT_AMBULATORY_CARE_PROVIDER_SITE_OTHER): Payer: Self-pay

## 2021-08-24 ENCOUNTER — Other Ambulatory Visit: Payer: Self-pay

## 2021-08-24 ENCOUNTER — Other Ambulatory Visit (HOSPITAL_COMMUNITY): Payer: Self-pay | Admitting: Student in an Organized Health Care Education/Training Program

## 2021-08-24 VITALS — BP 117/82 | HR 94 | Temp 98.1°F | Resp 16 | Ht 66.0 in | Wt 222.2 lb

## 2021-08-24 DIAGNOSIS — M25571 Pain in right ankle and joints of right foot: Secondary | ICD-10-CM | POA: Insufficient documentation

## 2021-08-24 DIAGNOSIS — Z6835 Body mass index (BMI) 35.0-35.9, adult: Secondary | ICD-10-CM

## 2021-08-24 DIAGNOSIS — Z87891 Personal history of nicotine dependence: Secondary | ICD-10-CM

## 2021-08-24 DIAGNOSIS — M25572 Pain in left ankle and joints of left foot: Secondary | ICD-10-CM | POA: Insufficient documentation

## 2021-08-24 LAB — CBC WITH DIFF
BASOPHIL #: <0.1 10*3/uL (ref <=0.20)
BASOPHIL %: 1 %
EOSINOPHIL #: 0.23 10*3/uL (ref <=0.50)
EOSINOPHIL %: 3 %
HCT: 45.7 % (ref 38.9–52.0)
HGB: 16.4 g/dL (ref 13.4–17.5)
IMMATURE GRANULOCYTE #: 0.1 10*3/uL — ABNORMAL HIGH (ref <0.10)
IMMATURE GRANULOCYTE %: 1 % (ref 0–1)
LYMPHOCYTE #: 2.11 10*3/uL (ref 1.00–4.80)
LYMPHOCYTE %: 23 %
MCH: 30.3 pg (ref 26.0–32.0)
MCHC: 35.9 g/dL — ABNORMAL HIGH (ref 31.0–35.5)
MCV: 84.5 fL (ref 78.0–100.0)
MONOCYTE #: 0.6 10*3/uL (ref 0.20–1.10)
MONOCYTE %: 6 %
MPV: 11.2 fL (ref 8.7–12.5)
NEUTROPHIL #: 6.21 10*3/uL (ref 1.50–7.70)
NEUTROPHIL %: 66 %
PLATELETS: 388 10*3/uL (ref 150–400)
RBC: 5.41 10*6/uL (ref 4.50–6.10)
RDW-CV: 12.1 % (ref 11.5–15.5)
WBC: 9.3 10*3/uL (ref 3.7–11.0)

## 2021-08-24 LAB — BASIC METABOLIC PANEL
ANION GAP: 13 mmol/L (ref 4–13)
BUN/CREA RATIO: 12 (ref 6–22)
BUN: 21 mg/dL (ref 8–25)
CALCIUM: 10.3 mg/dL — ABNORMAL HIGH (ref 8.5–10.0)
CHLORIDE: 103 mmol/L (ref 96–111)
CO2 TOTAL: 24 mmol/L (ref 22–30)
CREATININE: 1.77 mg/dL — ABNORMAL HIGH (ref 0.75–1.35)
ESTIMATED GFR: 49 mL/min/BSA — ABNORMAL LOW (ref >=60)
GLUCOSE: 119 mg/dL (ref 65–125)
POTASSIUM: 3.5 mmol/L (ref 3.5–5.1)
SODIUM: 140 mmol/L (ref 136–145)

## 2021-08-24 LAB — URIC ACID: URIC ACID: 9.5 mg/dL — ABNORMAL HIGH (ref 3.5–7.5)

## 2021-08-24 MED ORDER — PREDNISONE 20 MG TABLET
40.0000 mg | ORAL_TABLET | Freq: Every day | ORAL | 0 refills | Status: AC
Start: 2021-08-24 — End: 2021-08-29

## 2021-08-24 NOTE — Nursing Note (Signed)
Labs collected from right ac on first attempt. Patient tolerated. 1 light green tube and 1 lavender tube collected. 2x2 bandage applied to insertion site.Judeen Hammans, RN  08/24/2021, 16:48
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
## 2021-08-25 NOTE — Telephone Encounter (Signed)
Refill requested for the following medication(s). Last ordered:  metoprolol succinate (TOPROL-XL) 50 mg Oral Tablet Sustained Release 24 hr 30 Tablet 0 07/24/2021    Sig: Take 1 tablet by mouth once daily         Follow up:  09/20/2021 8:30 AM Darreld Mclean, DO [facility]       Tomasita Crumble, RN  08/25/2021, 11:08
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
## 2021-09-20 ENCOUNTER — Ambulatory Visit (INDEPENDENT_AMBULATORY_CARE_PROVIDER_SITE_OTHER): Payer: Medicaid Other

## 2021-09-20 ENCOUNTER — Other Ambulatory Visit: Payer: Self-pay

## 2021-09-20 ENCOUNTER — Ambulatory Visit (INDEPENDENT_AMBULATORY_CARE_PROVIDER_SITE_OTHER): Payer: Medicaid Other | Admitting: Student in an Organized Health Care Education/Training Program

## 2021-09-20 ENCOUNTER — Other Ambulatory Visit: Payer: Medicaid Other | Attending: Psychiatry | Admitting: Psychiatry

## 2021-09-20 VITALS — BP 118/70 | HR 84 | Temp 98.0°F | Resp 18 | Ht 66.0 in | Wt 230.2 lb

## 2021-09-20 DIAGNOSIS — Z79899 Other long term (current) drug therapy: Secondary | ICD-10-CM

## 2021-09-20 DIAGNOSIS — F439 Reaction to severe stress, unspecified: Secondary | ICD-10-CM

## 2021-09-20 DIAGNOSIS — F331 Major depressive disorder, recurrent, moderate: Secondary | ICD-10-CM

## 2021-09-20 DIAGNOSIS — F1021 Alcohol dependence, in remission: Secondary | ICD-10-CM

## 2021-09-20 DIAGNOSIS — F411 Generalized anxiety disorder: Secondary | ICD-10-CM

## 2021-09-20 DIAGNOSIS — Z6837 Body mass index (BMI) 37.0-37.9, adult: Secondary | ICD-10-CM

## 2021-09-20 DIAGNOSIS — F32A Depression, unspecified: Secondary | ICD-10-CM

## 2021-09-20 DIAGNOSIS — Z62811 Personal history of psychological abuse in childhood: Secondary | ICD-10-CM

## 2021-09-20 LAB — LIPID PANEL
CHOL/HDL RATIO: 4.3
CHOLESTEROL: 141 mg/dL (ref 100–200)
HDL CHOL: 33 mg/dL — ABNORMAL LOW (ref >=50)
LDL CALC: 74 mg/dL (ref <100)
NON-HDL: 108 mg/dL (ref <=190)
TRIGLYCERIDES: 168 mg/dL — ABNORMAL HIGH (ref <150)
VLDL CALC: 34 mg/dL — ABNORMAL HIGH (ref <30)

## 2021-09-20 MED ORDER — METOPROLOL SUCCINATE ER 50 MG TABLET,EXTENDED RELEASE 24 HR
50.0000 mg | ORAL_TABLET | Freq: Every day | ORAL | 1 refills | Status: DC
Start: 2021-09-20 — End: 2021-11-09

## 2021-09-20 MED ORDER — PAROXETINE 30 MG TABLET
ORAL_TABLET | ORAL | 3 refills | Status: DC
Start: 2021-09-20 — End: 2022-03-16

## 2021-09-20 MED ORDER — ARIPIPRAZOLE 5 MG TABLET
2.5000 mg | ORAL_TABLET | Freq: Every day | ORAL | 1 refills | Status: DC
Start: 2021-09-20 — End: 2021-10-27

## 2021-09-20 NOTE — Patient Instructions (Signed)
Instructions from our visit 09/20/21:    - Start brisk walking 20 minutes at least 3 days/week. Set reminder on phone or daily alarm to do this at the same time every day  - Sleep hygiene measures: If cannot fall asleep in bed for 30 minutes, get out of bed and do something else until you feel sleepy again and retry. Do not lay in bed for more than 30 minutes max. No electronics, eating, or vigorous exercise within a couple hours of sleeping. Ensure room is dark and cold as well to optimize sleep environment (can get black out curtains)  - Pick up new prescription of Abilify to augment depression and anxiety

## 2021-09-21 LAB — HGA1C (HEMOGLOBIN A1C WITH EST AVG GLUCOSE)
ESTIMATED AVERAGE GLUCOSE: 88 mg/dL
HEMOGLOBIN A1C: 4.7 % (ref 4.0–5.6)
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
## 2021-10-09 ENCOUNTER — Encounter (HOSPITAL_BASED_OUTPATIENT_CLINIC_OR_DEPARTMENT_OTHER): Payer: Self-pay | Admitting: Family Medicine

## 2021-10-16 ENCOUNTER — Telehealth (HOSPITAL_COMMUNITY): Payer: Self-pay

## 2021-10-16 NOTE — Telephone Encounter (Signed)
Case Manager left message with call back number to schedule therapy appt with K Reckart.    Frederick Schultz TOC    Frederick Schultz, CASE MANAGER  10/16/2021, 11:45

## 2021-10-26 ENCOUNTER — Other Ambulatory Visit (HOSPITAL_BASED_OUTPATIENT_CLINIC_OR_DEPARTMENT_OTHER): Payer: Self-pay | Admitting: Family Medicine
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
## 2021-10-27 ENCOUNTER — Other Ambulatory Visit (HOSPITAL_BASED_OUTPATIENT_CLINIC_OR_DEPARTMENT_OTHER): Payer: Self-pay | Admitting: Family Medicine

## 2021-10-27 ENCOUNTER — Encounter (HOSPITAL_COMMUNITY): Payer: Self-pay | Admitting: Student in an Organized Health Care Education/Training Program

## 2021-10-27 ENCOUNTER — Other Ambulatory Visit: Payer: Self-pay

## 2021-10-27 ENCOUNTER — Ambulatory Visit (INDEPENDENT_AMBULATORY_CARE_PROVIDER_SITE_OTHER): Payer: Medicaid Other | Admitting: Student in an Organized Health Care Education/Training Program

## 2021-10-27 VITALS — BP 126/78 | HR 88 | Ht 66.0 in | Wt 230.8 lb

## 2021-10-27 DIAGNOSIS — F439 Reaction to severe stress, unspecified: Secondary | ICD-10-CM

## 2021-10-27 DIAGNOSIS — Z6837 Body mass index (BMI) 37.0-37.9, adult: Secondary | ICD-10-CM

## 2021-10-27 DIAGNOSIS — Z79899 Other long term (current) drug therapy: Secondary | ICD-10-CM

## 2021-10-27 DIAGNOSIS — G4700 Insomnia, unspecified: Secondary | ICD-10-CM

## 2021-10-27 DIAGNOSIS — F411 Generalized anxiety disorder: Secondary | ICD-10-CM

## 2021-10-27 DIAGNOSIS — F32A Depression, unspecified: Secondary | ICD-10-CM

## 2021-10-27 DIAGNOSIS — F1021 Alcohol dependence, in remission: Secondary | ICD-10-CM

## 2021-10-27 MED ORDER — ARIPIPRAZOLE 5 MG TABLET
5.0000 mg | ORAL_TABLET | Freq: Every day | ORAL | 0 refills | Status: DC
Start: 2021-10-27 — End: 2021-11-24

## 2021-10-27 NOTE — Telephone Encounter (Signed)
Regarding: Rx refill  ----- Message from Marko Stai sent at 10/27/2021  2:48 PM EST -----  Leda Roys, DO  Pt is requesting a refill on the medication below    rOPINIRole (REQUIP) 1 mg Oral Tablet 90 Tablet 3 04/10/2021    Sig - Route: Take 1 Tablet (1 mg total) by mouth Every night For 4 weeks. May take 2 tabs nightly after 66mo - Oral   Patient not taking: Reported on 08/24/2021       Sent to pharmacy as: rOPINIRole 1 mg tablet (REQUIP)   Class: E-Rx         Preferred Pharmacy     [id number] - [place], [place] - [address]   [address] [place] [address]    Phone: [phone] Fax: [phone]    Hours: Not open 24 hours      Thank You Rene Kocher

## 2021-10-30 MED ORDER — ROPINIROLE 1 MG TABLET
2.0000 mg | ORAL_TABLET | Freq: Every evening | ORAL | 3 refills | Status: DC
Start: 2021-10-30 — End: 2022-11-06

## 2021-10-30 NOTE — Telephone Encounter (Signed)
Regarding: Rx refill  ----- Message from Myrtis Ser sent at 10/30/2021 11:14 AM EST -----  Leda Roys, DO    Pharmacy called stating they did not receive refill.    Thank you so much!  Asher Muir       ----- Message from Donald Pore Warnick sent at 10/27/2021  2:48 PM EST -----  Leda Roys, DO  Pt is requesting a refill on the medication below    rOPINIRole (REQUIP) 1 mg Oral Tablet 90 Tablet 3 04/10/2021    Sig - Route: Take 1 Tablet (1 mg total) by mouth Every night For 4 weeks. May take 2 tabs nightly after 79mo - Oral   Patient not taking: Reported on 08/24/2021       Sent to pharmacy as: rOPINIRole 1 mg tablet (REQUIP)   Class: E-Rx         Preferred Pharmacy     [id number] - [place], [place] - [address]   [address]    [address] [place] [address]    Phone: [phone] Fax: [phone]    Hours: Not open 24 hours      Thank You Rene Kocher

## 2021-11-08 ENCOUNTER — Ambulatory Visit (HOSPITAL_COMMUNITY): Payer: Self-pay

## 2021-11-08 NOTE — Telephone Encounter (Signed)
Case Manager left message with call back number to schedule therapy appt with K Reckart.  Domenick Bookbinder, CASE MANAGER  11/08/2021, 11:53

## 2021-11-08 NOTE — Telephone Encounter (Signed)
Regarding: restablish therapy  ----- Message from Sima Matas sent at 11/07/2021 10:16 AM EST -----  Pt wants to reestablish with therapy but does not want to see Latina Craver, LPC, [facility].  Pl;ease advise.   Sima Matas  11/07/2021, 10:16

## 2021-11-09 ENCOUNTER — Other Ambulatory Visit: Payer: Self-pay

## 2021-11-09 ENCOUNTER — Ambulatory Visit: Payer: Medicaid Other | Attending: Family Medicine | Admitting: Family Medicine

## 2021-11-09 ENCOUNTER — Encounter (HOSPITAL_BASED_OUTPATIENT_CLINIC_OR_DEPARTMENT_OTHER): Payer: Self-pay | Admitting: Family Medicine

## 2021-11-09 ENCOUNTER — Encounter (HOSPITAL_COMMUNITY): Payer: Self-pay | Admitting: Student in an Organized Health Care Education/Training Program

## 2021-11-09 VITALS — BP 118/84 | HR 85 | Temp 97.9°F | Ht 65.98 in | Wt 237.4 lb

## 2021-11-09 DIAGNOSIS — I10 Essential (primary) hypertension: Secondary | ICD-10-CM | POA: Insufficient documentation

## 2021-11-09 DIAGNOSIS — R0683 Snoring: Secondary | ICD-10-CM | POA: Insufficient documentation

## 2021-11-09 DIAGNOSIS — G8929 Other chronic pain: Secondary | ICD-10-CM | POA: Insufficient documentation

## 2021-11-09 DIAGNOSIS — F5101 Primary insomnia: Secondary | ICD-10-CM | POA: Insufficient documentation

## 2021-11-09 DIAGNOSIS — Z6838 Body mass index (BMI) 38.0-38.9, adult: Secondary | ICD-10-CM

## 2021-11-09 DIAGNOSIS — M1A0791 Idiopathic chronic gout, unspecified ankle and foot, with tophus (tophi): Secondary | ICD-10-CM | POA: Insufficient documentation

## 2021-11-09 DIAGNOSIS — M25512 Pain in left shoulder: Secondary | ICD-10-CM | POA: Insufficient documentation

## 2021-11-09 MED ORDER — METOPROLOL SUCCINATE ER 50 MG TABLET,EXTENDED RELEASE 24 HR
50.0000 mg | ORAL_TABLET | Freq: Every day | ORAL | 3 refills | Status: DC
Start: 2021-11-09 — End: 2022-11-27

## 2021-11-09 MED ORDER — CLOTRIMAZOLE-BETAMETHASONE 1 %-0.05 % TOPICAL CREAM
TOPICAL_CREAM | Freq: Two times a day (BID) | CUTANEOUS | 0 refills | Status: DC
Start: 2021-11-09 — End: 2022-02-12

## 2021-11-09 MED ORDER — AMLODIPINE 10 MG TABLET
10.0000 mg | ORAL_TABLET | Freq: Every day | ORAL | 3 refills | Status: DC
Start: 2021-11-09 — End: 2022-11-16

## 2021-11-09 MED ORDER — ALLOPURINOL 100 MG TABLET
100.0000 mg | ORAL_TABLET | Freq: Every day | ORAL | 1 refills | Status: DC
Start: 2021-11-09 — End: 2022-05-16

## 2021-11-09 NOTE — Progress Notes (Signed)
ASQ for ASD screening = 39. + but does not confirm ASD dx     Scanned into media section    Darreld Mclean, DO  11/09/2021, 20:03   Behavioral Medicine & Psychiatry  Resident, PGY-3      Late Entry for 11/09/2021. II reviewed the resident's note. I agree with the findings and plan of care, as documented in the resident's note. Any exceptions/ additions are edited/noted.    Dilip N. Christena Deem, M.D.   Staff Psychiatrist  Dilip Marcille Buffy, MD  11/10/2021, 09:24

## 2021-11-09 NOTE — Progress Notes (Signed)
Family Medicine, [facility]  [address]  [place] [address]  [phone]     Clinical Progress Note        Frederick GRISSETT  Date of Service: 11/09/2021    Chief complaint:   Chief Complaint   Patient presents with   . Hypertension       Subjective:    40 y.o.male with a past medical history of HTN, CKD III, mood disorder (GAD and MDD) and chronic back pain presents in follow up HTN and sleep.     In the interim from our last visit on 04/10/2021, he has persistent left shoulder pain. This pain has been going on for over 12 months. He has pain when he adducts his arm. Pain is in the anterior shoulder and lateral deltoid.      He also recently had a flare of his gout. This time the gout flare was in both feet. He feels at this time there is now a deformity. There is a deformity at the great toe MTP joint    In regards to hypertension: currently asymptomatic. Taking amlodipine 10mg  daily, metoprolol succinate 50mg  daily, triamterene-HCTZ 75mg -50mg  daily as instructed. Denies any adverse side effects.    In regards to his RLS, symptoms are Controlled. Currently taking ropinirole 2mg  nightly.     He does admit to poorly controlled sleep, however, he does follow with psychiatry. I have advised him to continue discussing options for sleep with psychiatry.      Medications:  Outpatient Medications Marked as Taking for the 11/09/21 encounter (Office Visit) with , DO   Medication Sig   . amLODIPine (NORVASC) 10 mg Oral Tablet Take 1 Tablet (10 mg total) by mouth Once a day   . ARIPiprazole (ABILIFY) 5 mg Oral Tablet Take 1 Tablet (5 mg total) by mouth Once a day Indications: additional treatment for major depressive disorder   . metoprolol succinate (TOPROL-XL) 50 mg Oral Tablet Sustained Release 24 hr Take 1 Tablet (50 mg total) by mouth Once a day   . PARoxetine (PAXIL) 30 mg Oral Tablet TAKE 2 TABLETS BY MOUTH IN THE EVENING Strength: 30 mg   . rOPINIRole (REQUIP) 1 mg  Oral Tablet Take 2 Tablets (2 mg total) by mouth Every night   . triamterene-hydroCHLOROthiazide (MAXZIDE) 75-50 mg Oral Tablet Take 1 Tablet by mouth Once a day          Allergies:  Allergies   Allergen Reactions   . Trazodone  Other Adverse Reaction (Add comment)     Priapism       Medical History:  Past Medical History:   Diagnosis Date   . Anxiety    . Asthma    . Bipolar 1 disorder (CMS HCC)    . Depression        Objective:  Vitals: BP 118/84   Pulse 85   Temp 36.6 C (97.9 F) (Thermal Scan)   Ht 1.676 m (5' 5.98")   Wt 108 kg (237 lb 7 oz)   SpO2 95%   BMI 38.34 kg/m     General: Well developed, well nourished. No acute distress  HEENT: Normocephalic, atraumatic. Extra occular movements intact bilaterally  Heart: Regular rate and rhythm, no appreciable murmurs, rubs, or gallops  Lungs: Clear to ascultation bilaterally, no appreciable wheezes, rales, or rhonchi  Psych: Awake, alert, pleasant, mood euthymic  Podiatry: MTP joints bilaterally with defority and mild nodule      Component  Latest Ref Rng & Units 08/24/2021 08/24/2021           4:42 PM  4:42 PM   SODIUM      136 - 145 mmol/L 140    POTASSIUM      3.5 - 5.1 mmol/L 3.5    CHLORIDE      96 - 111 mmol/L 103    CARBON DIOXIDE      22 - 30 mmol/L 24    ANION GAP      4 - 13 mmol/L 13    CALCIUM      8.5 - 10.0 mg/dL 30.0 (H)    GLUCOSE      65 - 125 mg/dL 762    BUN      8 - 25 mg/dL 21    CREATININE      2.63 - 1.35 mg/dL 3.35 (H)    BUN/CREAT RATIO      6 - 22 12    ESTIMATED GLOMERULAR FILTRATION RATE      >=60 mL/min/BSA 49 (L)    URIC ACID      3.5 - 7.5 mg/dL  9.5 (H)       Assessment/Plan:  1. Chronic left shoulder pain   -- Given the duration of pain, I do think he should see PT  -- ORDER Physical Therapy     2. Essential hypertension   -- Well controlled. We did have a long discussion about how his HCTZ is likely contributing to recurren gout and hypercalcemia. Advised to look into alternative options for HTN along with hypercalcemia  prior to adjusting meds given BP is so well controlled at this time  -- REFILL amlodipine 10mg  daily   -- Cont triamterene-HCTZ 75-50mg  daily  -- REFILL metoprolol succinate 50mg  daily      3. 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 I suspect HCTZ is playing a role. However, BP is well controlled and he is willing to start UA lower agent  -- START allopurinol 100mg  daily   -- UPDATE Uric Acid     4. Hypercalcemia   -- UPDATE BMP, PTH, and Vitamin D     5. Primary insomnia ; Snoring   -- Persistent, however, he is already on several sedating medications. Will work on further etiologies of his poor sleep while leaving med management to psych  -- ORDER at home Polysomnography       Return in about 3 months (around 02/09/2022) for recheck gout, HTN, and sleep.    On the day of the encounter, a total of  46 minutes was spent on this patient encounter including review of historical information, examination, documentation and post-visit activities. The time documented excludes procedural time.    Frederick Schultz B. , DO

## 2021-11-23 ENCOUNTER — Other Ambulatory Visit (HOSPITAL_COMMUNITY): Payer: Self-pay | Admitting: Student in an Organized Health Care Education/Training Program

## 2021-11-24 NOTE — Telephone Encounter (Signed)
Pt requests refill of medication(s).  Last ordered.    ARIPiprazole (ABILIFY) 5 mg Oral Tablet 30 Tablet 0 10/27/2021    Sig: Take 1 Tablet (5 mg total) by mouth Once a day Indications: additional treatment for major depressive disorder             Patient's last appointment was 10/27/2021  Patient's next scheduled appt is 12/01/2021          Reginald Weida D. Geraldo Pitter, RN, OCN

## 2021-12-01 ENCOUNTER — Ambulatory Visit (INDEPENDENT_AMBULATORY_CARE_PROVIDER_SITE_OTHER): Payer: Medicaid Other | Admitting: Student in an Organized Health Care Education/Training Program

## 2021-12-01 ENCOUNTER — Other Ambulatory Visit: Payer: Self-pay

## 2021-12-01 VITALS — BP 114/75 | HR 86 | Ht 66.0 in | Wt 240.1 lb

## 2021-12-01 DIAGNOSIS — Z6838 Body mass index (BMI) 38.0-38.9, adult: Secondary | ICD-10-CM

## 2021-12-01 DIAGNOSIS — F32A Depression, unspecified: Secondary | ICD-10-CM

## 2021-12-01 DIAGNOSIS — F4010 Social phobia, unspecified: Secondary | ICD-10-CM

## 2021-12-01 DIAGNOSIS — F411 Generalized anxiety disorder: Secondary | ICD-10-CM

## 2021-12-01 DIAGNOSIS — F1021 Alcohol dependence, in remission: Secondary | ICD-10-CM

## 2021-12-01 DIAGNOSIS — Z0189 Encounter for other specified special examinations: Secondary | ICD-10-CM

## 2021-12-01 DIAGNOSIS — Z79899 Other long term (current) drug therapy: Secondary | ICD-10-CM

## 2021-12-01 DIAGNOSIS — F439 Reaction to severe stress, unspecified: Secondary | ICD-10-CM

## 2021-12-01 MED ORDER — ARIPIPRAZOLE 10 MG TABLET
10.0000 mg | ORAL_TABLET | Freq: Every day | ORAL | 2 refills | Status: DC
Start: 2021-12-01 — End: 2022-02-26
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
## 2021-12-22 ENCOUNTER — Encounter (INDEPENDENT_AMBULATORY_CARE_PROVIDER_SITE_OTHER): Payer: Self-pay | Admitting: Clinical Neuropsychologist

## 2022-01-19 ENCOUNTER — Ambulatory Visit (INDEPENDENT_AMBULATORY_CARE_PROVIDER_SITE_OTHER): Payer: Medicaid Other | Admitting: Student in an Organized Health Care Education/Training Program

## 2022-01-19 ENCOUNTER — Other Ambulatory Visit: Payer: Self-pay

## 2022-01-19 VITALS — BP 135/85 | HR 76 | Temp 97.0°F | Resp 18 | Ht 66.0 in | Wt 241.0 lb

## 2022-01-19 DIAGNOSIS — F1021 Alcohol dependence, in remission: Secondary | ICD-10-CM

## 2022-01-19 DIAGNOSIS — E669 Obesity, unspecified: Secondary | ICD-10-CM

## 2022-01-19 DIAGNOSIS — F32A Depression, unspecified: Secondary | ICD-10-CM

## 2022-01-19 DIAGNOSIS — F411 Generalized anxiety disorder: Secondary | ICD-10-CM

## 2022-01-19 DIAGNOSIS — Z79899 Other long term (current) drug therapy: Secondary | ICD-10-CM

## 2022-01-19 DIAGNOSIS — F4010 Social phobia, unspecified: Secondary | ICD-10-CM

## 2022-01-19 DIAGNOSIS — Z6838 Body mass index (BMI) 38.0-38.9, adult: Secondary | ICD-10-CM

## 2022-01-19 DIAGNOSIS — F439 Reaction to severe stress, unspecified: Secondary | ICD-10-CM

## 2022-01-19 MED ORDER — METFORMIN 500 MG TABLET
500.0000 mg | ORAL_TABLET | Freq: Two times a day (BID) | ORAL | 1 refills | Status: DC
Start: 2022-01-19 — End: 2022-02-12
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
## 2022-02-12 ENCOUNTER — Other Ambulatory Visit: Payer: Self-pay

## 2022-02-12 ENCOUNTER — Encounter (HOSPITAL_BASED_OUTPATIENT_CLINIC_OR_DEPARTMENT_OTHER): Payer: Self-pay | Admitting: Family Medicine

## 2022-02-12 ENCOUNTER — Ambulatory Visit: Payer: Medicaid Other | Attending: Family Medicine | Admitting: Family Medicine

## 2022-02-12 VITALS — BP 110/70 | HR 78 | Temp 98.3°F | Wt 230.4 lb

## 2022-02-12 DIAGNOSIS — F5101 Primary insomnia: Secondary | ICD-10-CM | POA: Insufficient documentation

## 2022-02-12 DIAGNOSIS — Z6837 Body mass index (BMI) 37.0-37.9, adult: Secondary | ICD-10-CM

## 2022-02-12 DIAGNOSIS — I10 Essential (primary) hypertension: Secondary | ICD-10-CM | POA: Insufficient documentation

## 2022-02-12 DIAGNOSIS — F39 Unspecified mood [affective] disorder: Secondary | ICD-10-CM | POA: Insufficient documentation

## 2022-02-12 DIAGNOSIS — G2581 Restless legs syndrome: Secondary | ICD-10-CM | POA: Insufficient documentation

## 2022-02-12 NOTE — Progress Notes (Signed)
Family Medicine, [facility]  [address]  [place] [address]  [phone]     Clinical Progress Note        Frederick Schultz  Date of Service: 02/12/2022    Chief complaint:   Chief Complaint   Patient presents with   . Follow Up 3 Months   . Hypertension   . Gout   . Sleep Problem       Subjective:    41 y.o.male with a past medical history of HTN, CKD III, mood disorder (GAD and MDD) and chronic back pain presents in follow up for chronic disease management.    In regards to his RLS, symptoms are uncontrolled. Controlled. Currently taking ropinirole 2mg  nightly.    In regards to hypertension: currently asymptomatic. Taking amlodipine 10mg  daily, metoprolol succinate 50mg  daily, triamterene-HCTZ 75mg -50mg  daily as instructed. Denies any adverse side effects.     In regards to his mood, symptoms are stable. Does follow with [facility]. Currently taking aripiprazole 10mg  daily, paroxetine 30mg  daily, but no longer taking hydroxyzine.        Medications:  Outpatient Medications Marked as Taking for the 02/12/22 encounter (Office Visit) with , DO   Medication Sig   . allopurinoL (ZYLOPRIM) 100 mg Oral Tablet Take 1 Tablet (100 mg total) by mouth Once a day   . amLODIPine (NORVASC) 10 mg Oral Tablet Take 1 Tablet (10 mg total) by mouth Once a day   . ARIPiprazole (ABILIFY) 10 mg Oral Tablet Take 1 Tablet (10 mg total) by mouth Once a day   . metFORMIN (GLUCOPHAGE) 500 mg Oral Tablet Take 1 Tablet (500 mg total) by mouth Twice daily with food   . metoprolol succinate (TOPROL-XL) 50 mg Oral Tablet Sustained Release 24 hr Take 1 Tablet (50 mg total) by mouth Once a day   . PARoxetine (PAXIL) 30 mg Oral Tablet TAKE 2 TABLETS BY MOUTH IN THE EVENING Strength: 30 mg   . rOPINIRole (REQUIP) 1 mg Oral Tablet Take 2 Tablets (2 mg total) by mouth Every night   . triamterene-hydroCHLOROthiazide (MAXZIDE) 75-50 mg Oral Tablet Take 1 Tablet by mouth Once a day           Allergies:  Allergies   Allergen Reactions   . Trazodone  Other Adverse Reaction (Add comment)     Priapism       Medical History:  Past Medical History:   Diagnosis Date   . Anxiety    . Asthma    . Bipolar 1 disorder (CMS HCC)    . Depression        Objective:  Blood pressure 110/70, pulse 78, temperature 36.8 C (98.3 F), temperature source Thermal Scan, weight 104 kg (230 lb 6.1 oz), SpO2 97 %.  General: Well developed, well nourished. No acute distress  HEENT: Normocephalic, atraumatic. Extra occular movements intact bilaterally  Heart: Regular rate and rhythm, no appreciable murmurs, rubs, or gallops  Lungs: Clear to ascultation bilaterally, no appreciable wheezes, rales, or rhonchi  Abdomen: Bowel Sounds present in all 4 quadrants, soft, non-tender, non-distended, no appreciable rebound, guarding, or rigidity  Extremities: No appreciable clubbing, cyanosis, or edema  Psych: Awake, alert, pleasant, mood euthymic        Assessment/Plan:  Assessment/Plan   1. RLS (restless legs syndrome)   -- Persistent and uncontrolled. I do not think we will gain much more relief by increasing his ropinirole. I would ike to convert from ropinirole to gabapentin but  I would feel better knowing his [facility] team is on board with this conversion. I will send them a message about this request     2. Essential hypertension   -- Well controlled. The goal will be to try and decrease amlodipine to 5mg  at next visit if BP remains well controlled  -- Cont amlodipine 10mg  daily   -- Cont triamterene-HCTZ 75-50mg  daily  -- Cont metoprolol succinate 50mg  daily      3. Primary insomnia   -- Persistent, however, he does have a sleep study coming up next month     4. Mood disorder (CMS HCC)   -- Persistent  -- Cont aripiprazole 10mg  daily   -- Cont paroxetine 30mg  daily        Return in about 3 months (around 05/15/2022) for Recheck sleep and RLS.      Edna Grover B. , DO

## 2022-02-23 ENCOUNTER — Other Ambulatory Visit (HOSPITAL_COMMUNITY): Payer: Self-pay | Admitting: Student in an Organized Health Care Education/Training Program

## 2022-02-26 NOTE — Telephone Encounter (Signed)
Refill requested for the following medication(s). Last ordered:  ARIPiprazole (ABILIFY) 10 mg Oral Tablet 30 Tablet 2 12/01/2021    Sig: Take 1 Tablet (10 mg total) by mouth Once a day         Last appointment: 01/19/22    Follow up:  03/16/2022 10:30 AM Kerin Salen, MD [facility]IOR[facility]       Tomasita Crumble, RN  02/26/2022, 10:37

## 2022-03-16 ENCOUNTER — Other Ambulatory Visit: Payer: Self-pay

## 2022-03-16 ENCOUNTER — Ambulatory Visit (INDEPENDENT_AMBULATORY_CARE_PROVIDER_SITE_OTHER): Payer: Medicaid Other | Admitting: Student in an Organized Health Care Education/Training Program

## 2022-03-16 DIAGNOSIS — F411 Generalized anxiety disorder: Secondary | ICD-10-CM

## 2022-03-16 DIAGNOSIS — F32A Depression, unspecified: Secondary | ICD-10-CM

## 2022-03-16 DIAGNOSIS — F4010 Social phobia, unspecified: Secondary | ICD-10-CM

## 2022-03-16 DIAGNOSIS — Z62810 Personal history of physical and sexual abuse in childhood: Secondary | ICD-10-CM

## 2022-03-16 DIAGNOSIS — E669 Obesity, unspecified: Secondary | ICD-10-CM

## 2022-03-16 DIAGNOSIS — F1021 Alcohol dependence, in remission: Secondary | ICD-10-CM

## 2022-03-16 DIAGNOSIS — Z62811 Personal history of psychological abuse in childhood: Secondary | ICD-10-CM

## 2022-03-16 DIAGNOSIS — F439 Reaction to severe stress, unspecified: Secondary | ICD-10-CM

## 2022-03-16 MED ORDER — ARIPIPRAZOLE 10 MG TABLET
10.0000 mg | ORAL_TABLET | Freq: Every day | ORAL | 1 refills | Status: DC
Start: 2022-03-16 — End: 2022-04-27

## 2022-03-16 MED ORDER — PAROXETINE 30 MG TABLET
ORAL_TABLET | ORAL | 3 refills | Status: DC
Start: 2022-03-16 — End: 2022-08-06
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
## 2022-03-19 ENCOUNTER — Other Ambulatory Visit: Payer: Self-pay

## 2022-03-19 ENCOUNTER — Ambulatory Visit
Admission: RE | Admit: 2022-03-19 | Discharge: 2022-03-19 | Disposition: A | Discharge status: Home / Self Care | Payer: Medicaid Other | Source: Ambulatory Visit | Attending: Family Medicine | Admitting: Family Medicine

## 2022-03-19 DIAGNOSIS — F5101 Primary insomnia: Secondary | ICD-10-CM | POA: Insufficient documentation

## 2022-03-19 DIAGNOSIS — I10 Essential (primary) hypertension: Secondary | ICD-10-CM | POA: Insufficient documentation

## 2022-03-19 DIAGNOSIS — R0683 Snoring: Secondary | ICD-10-CM | POA: Insufficient documentation

## 2022-03-20 ENCOUNTER — Telehealth (HOSPITAL_COMMUNITY): Payer: Self-pay

## 2022-03-20 DIAGNOSIS — R0683 Snoring: Secondary | ICD-10-CM

## 2022-03-20 DIAGNOSIS — F5101 Primary insomnia: Secondary | ICD-10-CM

## 2022-03-20 DIAGNOSIS — I10 Essential (primary) hypertension: Secondary | ICD-10-CM

## 2022-03-20 NOTE — Telephone Encounter (Signed)
-----   Message from Greg Cutter, CASE MANAGER sent at 03/16/2022 12:00 PM EDT -----  Regarding: RE: Therapy re-establish  Hi Alinda Money,    I am still covering for Steph, but just wanted to let you knowCaryl Asp (who I've attached), actually does adult therapy scheduling, so she would be the most helpful :)    Thanks!  Efrain Sella  ----- Message -----  From: Kerin Salen, MD  Sent: 03/16/2022  11:55 AM EDT  To: Charlsie Merles. Judeth Cornfield, CASE MANAGER  Subject: Therapy re-establish                             Hi Judeth Cornfield,    This outpatient of mine used to follow with Levonne Hubert for therapy and I was hoping we could get him to re-establish. I understand we have tried to call him in the past and I discussed with him and he assured me he can return calls now.    Thank you!    Alinda Money

## 2022-03-20 NOTE — Telephone Encounter (Signed)
Case Manager left message with call back number to schedule therapy appt with K Reckart @ [facility].    Please schedule patient in next available NPV/NMYCVV.   Domenick Bookbinder, CASE MANAGER  03/20/2022, 10:28

## 2022-03-30 NOTE — Procedures (Unsigned)
NAME:  Frederick Schultz, PARISH  HOSPITAL NUMBER:  [id number]  DATE OF SERVICE:  03/19/2022  DOB:  1981-05-25  SEX:  M      March 28, 2022    Leda Roys, DO  [address]  [place], [place] [address]    Dear Dr. Revonda Standard:    On the evening of March 20, 2022, Joe Gee underwent an unattended level 3 home sleep study.  His chief complaint is difficulty falling asleep.  This study is indicated to assess for obstructive sleep apnea.    Technique:  This unattended level 3 home sleep study was obtained using the ResMed AirView system.    Study Results:  On the evening of his unattended level 3 home sleep study, Zayin Valadez had a recording time of 45 minutes, flow evaluation time 9 minutes and oxygen saturation evaluation time 9 minutes.  There was only 1 apnea recorded, providing an Apnea-Hypopnea Index of 6.1.  Oxygen nadir 91%.  Average heart rate 70 with range 59 to 89.    Clinical Interpretation:  This sleep study with recording time of 45 minutes is unreliable to diagnose obstructive sleep apnea.  I recommend in-lab testing if concerns for obstructive sleep apnea are strong.     Sincerely,      Elnoria Howard, MD  Associate Professor  Otolaryngology, Sleep Medicine  [facility] Otol[facility]ology Head and Neck [facility]          CC:   Leda Roys, DO  Fax: [phone]       DD:  03/28/2022 16:23:09  DT:  03/29/2022 13:19:54 CK  D#:  [id number]

## 2022-04-27 ENCOUNTER — Ambulatory Visit (INDEPENDENT_AMBULATORY_CARE_PROVIDER_SITE_OTHER): Payer: Medicaid Other | Admitting: Student in an Organized Health Care Education/Training Program

## 2022-04-27 ENCOUNTER — Other Ambulatory Visit: Payer: Self-pay

## 2022-04-27 DIAGNOSIS — F1091 Alcohol use, unspecified, in remission: Secondary | ICD-10-CM

## 2022-04-27 DIAGNOSIS — F439 Reaction to severe stress, unspecified: Secondary | ICD-10-CM

## 2022-04-27 DIAGNOSIS — F4010 Social phobia, unspecified: Secondary | ICD-10-CM

## 2022-04-27 DIAGNOSIS — F32A Depression, unspecified: Secondary | ICD-10-CM

## 2022-04-27 DIAGNOSIS — F411 Generalized anxiety disorder: Secondary | ICD-10-CM

## 2022-04-27 DIAGNOSIS — Z8659 Personal history of other mental and behavioral disorders: Secondary | ICD-10-CM

## 2022-04-27 MED ORDER — HYDROXYZINE PAMOATE 50 MG CAPSULE
50.0000 mg | ORAL_CAPSULE | Freq: Three times a day (TID) | ORAL | 1 refills | Status: DC | PRN
Start: 2022-04-27 — End: 2022-06-29

## 2022-04-27 MED ORDER — ARIPIPRAZOLE 15 MG TABLET
15.0000 mg | ORAL_TABLET | Freq: Every day | ORAL | 1 refills | Status: DC
Start: 2022-04-27 — End: 2022-06-25
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
## 2022-05-13 ENCOUNTER — Other Ambulatory Visit (HOSPITAL_BASED_OUTPATIENT_CLINIC_OR_DEPARTMENT_OTHER): Payer: Self-pay | Admitting: Family Medicine

## 2022-05-21 ENCOUNTER — Ambulatory Visit: Payer: Medicaid Other | Attending: Family Medicine | Admitting: Family Medicine

## 2022-05-21 ENCOUNTER — Encounter (HOSPITAL_BASED_OUTPATIENT_CLINIC_OR_DEPARTMENT_OTHER): Payer: Self-pay | Admitting: Family Medicine

## 2022-05-21 ENCOUNTER — Other Ambulatory Visit: Payer: Self-pay

## 2022-05-21 VITALS — BP 118/76 | HR 88 | Temp 98.0°F | Ht 66.0 in | Wt 224.6 lb

## 2022-05-21 DIAGNOSIS — I10 Essential (primary) hypertension: Secondary | ICD-10-CM | POA: Insufficient documentation

## 2022-05-21 DIAGNOSIS — G2581 Restless legs syndrome: Secondary | ICD-10-CM | POA: Insufficient documentation

## 2022-05-21 DIAGNOSIS — F39 Unspecified mood [affective] disorder: Secondary | ICD-10-CM | POA: Insufficient documentation

## 2022-05-21 DIAGNOSIS — M25512 Pain in left shoulder: Secondary | ICD-10-CM | POA: Insufficient documentation

## 2022-05-21 DIAGNOSIS — F5101 Primary insomnia: Secondary | ICD-10-CM | POA: Insufficient documentation

## 2022-05-21 DIAGNOSIS — G8929 Other chronic pain: Secondary | ICD-10-CM | POA: Insufficient documentation

## 2022-05-21 NOTE — Progress Notes (Signed)
Family Medicine, [facility]  [address]  [place] [address]  [phone]     Clinical Progress Note        TRUNG WENZL  Date of Service: 05/21/2022    Chief complaint:   Chief Complaint   Patient presents with    Medication Check       Subjective:    41 y.o.male with a past medical history of HTN, CKD III, mood disorder (GAD and MDD) and chronic back pain presents in follow up for chronic disease management.    In regards to his RLS, symptoms are better than they were. Still a bit of a problem but better. Does think the ropinirole 2mg  nightly is helping.     In regards to hypertension: currently asymptomatic. Taking amlodipine 10mg  daily, metoprolol succinate 50mg  daily, triamterene-HCTZ 75mg -50mg  daily as instructed. Denies any adverse side effects.     In regards to his mood, his intrusive thoughts are better with latest dose increase in Abilify. He also notices the general doom and gloom thoughts are better. Does follow with [facility]. Currently taking aripiprazole 15mg  daily, paroxetine 30mg  daily, and hydroxyzine 50mg  Q8hrs prn    His shoulder still bothers him. It all started around the time he got the COVID booster.       Medications:  Outpatient Medications Marked as Taking for the 05/21/22 encounter (Office Visit) with , DO   Medication Sig    allopurinoL (ZYLOPRIM) 100 mg Oral Tablet Take 1 Tablet (100 mg total) by mouth Once a day    amLODIPine (NORVASC) 10 mg Oral Tablet Take 1 Tablet (10 mg total) by mouth Once a day    ARIPiprazole (ABILIFY) 15 mg Oral Tablet Take 1 Tablet (15 mg total) by mouth Once a day    hydrOXYzine pamoate (VISTARIL) 25 mg Oral Capsule Take 2 Capsules (50 mg total) by mouth Three times a day as needed for Itching    hydrOXYzine pamoate (VISTARIL) 50 mg Oral Capsule Take 1 Capsule (50 mg total) by mouth Three times a day as needed for Anxiety    metoprolol succinate (TOPROL-XL) 50 mg Oral Tablet Sustained Release 24 hr Take 1  Tablet (50 mg total) by mouth Once a day    PARoxetine (PAXIL) 30 mg Oral Tablet TAKE 2 TABLETS BY MOUTH IN THE EVENING Strength: 30 mg    rOPINIRole (REQUIP) 1 mg Oral Tablet Take 2 Tablets (2 mg total) by mouth Every night    triamterene-hydroCHLOROthiazide (MAXZIDE) 75-50 mg Oral Tablet Take 1 Tablet by mouth Once a day          Allergies:  Allergies   Allergen Reactions    Trazodone  Other Adverse Reaction (Add comment)     Priapism       Medical History:  Past Medical History:   Diagnosis Date    Anxiety     Asthma     Bipolar 1 disorder (CMS HCC)     Depression        Objective:  Blood pressure 118/76, pulse 88, temperature 36.7 C (98 F), height 1.676 m (5\' 6" ), weight 102 kg (224 lb 10.4 oz), SpO2 96 %.  General: Well developed, well nourished. No acute distress  HEENT: Normocephalic, atraumatic. Extra occular movements intact bilaterally  Heart: Regular rate and rhythm, no appreciable murmurs, rubs, or gallops  Lungs: Clear to ascultation bilaterally, no appreciable wheezes, rales, or rhonchi  Abdomen: Bowel Sounds present in all 4 quadrants, soft, non-tender, non-distended,  no appreciable rebound, guarding, or rigidity  Extremities: No appreciable clubbing, cyanosis, or edema  Psych: Awake, alert, pleasant, mood euthymic        Assessment/Plan:  Assessment/Plan   1. Chronic left shoulder pain   -- Persistent and no improvement with PT or conservative OTC analgesics. Agreeable to referral to Sports Med for further evaluation  -- REFER to Sports Medicine     2. Essential hypertension   -- Well controlled. Discussed dose adjustments, but given stability, will plan to keep regimen the same  -- Cont amlodipine 10mg  daily   -- Cont triamterene-HCTZ 75-50mg  daily  -- Cont metoprolol succinate 50mg  daily      3. RLS (restless legs syndrome)   -- Improved  -- Cont ropinirole 1mg  (2 tabs = 2mg ) nightly      4. Primary insomnia   -- Persistent but stable. Had an unreliable unattended sleep study. Will hold off at  this time, but may need to consider in-lab sleep study in the future     5. Mood disorder (CMS HCC)   -- Persistent but somewhat improved. Cont to follow with [facility]  -- Cont aripiprazole 15mg  daily   -- Cont paroxetine 30mg  daily   -- Cont hydrozyxine 50mg  Q8hrs prn       Return in about 6 months (around 11/19/2022) for Recheck HTN and sleep.      Murriel Eidem B. , DO

## 2022-06-04 ENCOUNTER — Other Ambulatory Visit (HOSPITAL_BASED_OUTPATIENT_CLINIC_OR_DEPARTMENT_OTHER): Payer: Self-pay | Admitting: Family Medicine

## 2022-06-05 ENCOUNTER — Encounter (HOSPITAL_BASED_OUTPATIENT_CLINIC_OR_DEPARTMENT_OTHER): Payer: Self-pay

## 2022-06-05 NOTE — Progress Notes (Signed)
1st LMOM for pt to call the [facility] lab to go over the results of their sleep study done on 03/19/22.  Eugenia Mcalpine, RPSGT 06/05/2022, 14:45

## 2022-06-22 ENCOUNTER — Encounter (HOSPITAL_COMMUNITY): Payer: Self-pay | Admitting: Student in an Organized Health Care Education/Training Program

## 2022-06-23 ENCOUNTER — Other Ambulatory Visit (HOSPITAL_COMMUNITY): Payer: Self-pay | Admitting: Student in an Organized Health Care Education/Training Program

## 2022-06-25 NOTE — Telephone Encounter (Signed)
Refill requested for the following medication(s). Last ordered:      ARIPiprazole (ABILIFY) 15 mg Oral Tablet Take 1 Tablet (15 mg total) by mouth Once a day Dispense: 30 Tablet, Refills: 1 ordered  04/27/2022     Last appointment: 04/27/2022    Follow up:    06/29/2022    Marrian Salvage, RN  06/25/2022, 07:30

## 2022-06-29 ENCOUNTER — Ambulatory Visit (INDEPENDENT_AMBULATORY_CARE_PROVIDER_SITE_OTHER): Payer: Medicaid Other | Admitting: Student in an Organized Health Care Education/Training Program

## 2022-06-29 ENCOUNTER — Other Ambulatory Visit: Payer: Self-pay

## 2022-06-29 VITALS — BP 110/75 | HR 66 | Ht 66.0 in | Wt 214.3 lb

## 2022-06-29 DIAGNOSIS — F1091 Alcohol use, unspecified, in remission: Secondary | ICD-10-CM

## 2022-06-29 DIAGNOSIS — F32A Depression, unspecified: Secondary | ICD-10-CM

## 2022-06-29 DIAGNOSIS — F4010 Social phobia, unspecified: Secondary | ICD-10-CM

## 2022-06-29 DIAGNOSIS — F411 Generalized anxiety disorder: Secondary | ICD-10-CM

## 2022-06-29 DIAGNOSIS — F439 Reaction to severe stress, unspecified: Secondary | ICD-10-CM

## 2022-06-29 MED ORDER — HYDROXYZINE PAMOATE 25 MG CAPSULE
25.0000 mg | ORAL_CAPSULE | Freq: Three times a day (TID) | ORAL | 1 refills | Status: DC | PRN
Start: 2022-06-29 — End: 2023-09-05

## 2022-06-29 MED ORDER — ARIPIPRAZOLE 20 MG TABLET
20.0000 mg | ORAL_TABLET | Freq: Every day | ORAL | 2 refills | Status: DC
Start: 2022-06-29 — End: 2022-08-21
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
## 2022-07-10 ENCOUNTER — Ambulatory Visit (INDEPENDENT_AMBULATORY_CARE_PROVIDER_SITE_OTHER): Payer: Medicaid Other | Admitting: Mental Health

## 2022-07-26 ENCOUNTER — Encounter (HOSPITAL_COMMUNITY): Payer: Self-pay | Admitting: Student in an Organized Health Care Education/Training Program

## 2022-07-31 ENCOUNTER — Encounter (HOSPITAL_BASED_OUTPATIENT_CLINIC_OR_DEPARTMENT_OTHER): Payer: Self-pay

## 2022-07-31 NOTE — Progress Notes (Signed)
Left message for patient to contact sleep center for sleep results. Waynetta Pean RPSGT

## 2022-08-03 ENCOUNTER — Other Ambulatory Visit (HOSPITAL_COMMUNITY): Payer: Self-pay | Admitting: Student in an Organized Health Care Education/Training Program

## 2022-08-06 NOTE — Telephone Encounter (Signed)
Refill requested for the following medication(s). Last ordered:      PARoxetine (PAXIL) 30 mg Oral Tablet TAKE 2 TABLETS BY MOUTH IN THE EVENING Strength: 30 mg Dispense: 60 Tablet, Refills: 3 ordered  03/16/2022     Last appointment: 06/29/2022    Follow up:    08/21/2022    Marrian Salvage, RN  08/06/2022, 08:14

## 2022-08-17 ENCOUNTER — Encounter (HOSPITAL_COMMUNITY): Payer: Self-pay | Admitting: Student in an Organized Health Care Education/Training Program

## 2022-08-21 ENCOUNTER — Other Ambulatory Visit: Payer: Self-pay

## 2022-08-21 ENCOUNTER — Ambulatory Visit (INDEPENDENT_AMBULATORY_CARE_PROVIDER_SITE_OTHER): Payer: Medicaid Other | Admitting: Student in an Organized Health Care Education/Training Program

## 2022-08-21 VITALS — BP 124/86 | HR 68 | Ht 66.0 in | Wt 209.4 lb

## 2022-08-21 DIAGNOSIS — F4320 Adjustment disorder, unspecified: Secondary | ICD-10-CM

## 2022-08-21 DIAGNOSIS — Z79899 Other long term (current) drug therapy: Secondary | ICD-10-CM

## 2022-08-21 DIAGNOSIS — G2581 Restless legs syndrome: Secondary | ICD-10-CM

## 2022-08-21 DIAGNOSIS — F32A Depression, unspecified: Secondary | ICD-10-CM

## 2022-08-21 DIAGNOSIS — F1091 Alcohol use, unspecified, in remission: Secondary | ICD-10-CM

## 2022-08-21 DIAGNOSIS — I10 Essential (primary) hypertension: Secondary | ICD-10-CM

## 2022-08-21 DIAGNOSIS — F411 Generalized anxiety disorder: Secondary | ICD-10-CM

## 2022-08-21 MED ORDER — ARIPIPRAZOLE 20 MG TABLET
20.0000 mg | ORAL_TABLET | Freq: Every day | ORAL | 2 refills | Status: DC
Start: 2022-08-21 — End: 2022-12-27

## 2022-08-21 MED ORDER — PAROXETINE 30 MG TABLET
ORAL_TABLET | ORAL | 0 refills | Status: DC
Start: 2022-08-21 — End: 2022-09-04
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
## 2022-09-03 ENCOUNTER — Other Ambulatory Visit (HOSPITAL_COMMUNITY): Payer: Self-pay | Admitting: Student in an Organized Health Care Education/Training Program

## 2022-10-18 ENCOUNTER — Encounter (HOSPITAL_COMMUNITY): Payer: Self-pay | Admitting: Student in an Organized Health Care Education/Training Program

## 2022-11-04 ENCOUNTER — Other Ambulatory Visit (HOSPITAL_BASED_OUTPATIENT_CLINIC_OR_DEPARTMENT_OTHER): Payer: Self-pay | Admitting: Family Medicine

## 2022-11-08 ENCOUNTER — Other Ambulatory Visit: Payer: Self-pay

## 2022-11-08 ENCOUNTER — Ambulatory Visit (INDEPENDENT_AMBULATORY_CARE_PROVIDER_SITE_OTHER): Payer: Medicaid Other | Admitting: Student in an Organized Health Care Education/Training Program

## 2022-11-08 VITALS — BP 116/76 | HR 71 | Resp 18 | Ht 66.0 in | Wt 211.6 lb

## 2022-11-08 DIAGNOSIS — G4700 Insomnia, unspecified: Secondary | ICD-10-CM

## 2022-11-08 DIAGNOSIS — E663 Overweight: Secondary | ICD-10-CM

## 2022-11-08 DIAGNOSIS — F411 Generalized anxiety disorder: Secondary | ICD-10-CM

## 2022-11-08 DIAGNOSIS — F32A Depression, unspecified: Secondary | ICD-10-CM

## 2022-11-08 DIAGNOSIS — Z79899 Other long term (current) drug therapy: Secondary | ICD-10-CM
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
## 2022-11-12 ENCOUNTER — Telehealth (HOSPITAL_COMMUNITY): Payer: Self-pay

## 2022-11-12 NOTE — Telephone Encounter (Signed)
Patient has been placed on the therapy wait list. Patient was made aware of the wait list process.  Alease Medina, CASE MANAGER  11/12/2022, 10:48

## 2022-11-15 ENCOUNTER — Other Ambulatory Visit (HOSPITAL_BASED_OUTPATIENT_CLINIC_OR_DEPARTMENT_OTHER): Payer: Self-pay | Admitting: Family Medicine

## 2022-11-15 DIAGNOSIS — I10 Essential (primary) hypertension: Secondary | ICD-10-CM

## 2022-11-25 ENCOUNTER — Other Ambulatory Visit (HOSPITAL_BASED_OUTPATIENT_CLINIC_OR_DEPARTMENT_OTHER): Payer: Self-pay | Admitting: Family Medicine

## 2022-11-25 DIAGNOSIS — I10 Essential (primary) hypertension: Secondary | ICD-10-CM

## 2022-11-26 ENCOUNTER — Ambulatory Visit: Payer: Medicaid Other | Attending: Family Medicine | Admitting: Family Medicine

## 2022-11-26 ENCOUNTER — Other Ambulatory Visit: Payer: Self-pay

## 2022-11-26 ENCOUNTER — Encounter (HOSPITAL_BASED_OUTPATIENT_CLINIC_OR_DEPARTMENT_OTHER): Payer: Self-pay | Admitting: Family Medicine

## 2022-11-26 VITALS — BP 106/78 | HR 77 | Temp 98.9°F | Wt 209.0 lb

## 2022-11-26 DIAGNOSIS — F39 Unspecified mood [affective] disorder: Secondary | ICD-10-CM | POA: Insufficient documentation

## 2022-11-26 DIAGNOSIS — S46012D Strain of muscle(s) and tendon(s) of the rotator cuff of left shoulder, subsequent encounter: Secondary | ICD-10-CM | POA: Insufficient documentation

## 2022-11-26 DIAGNOSIS — R634 Abnormal weight loss: Secondary | ICD-10-CM | POA: Insufficient documentation

## 2022-11-26 DIAGNOSIS — I10 Essential (primary) hypertension: Secondary | ICD-10-CM | POA: Insufficient documentation

## 2022-11-26 DIAGNOSIS — G8929 Other chronic pain: Secondary | ICD-10-CM

## 2022-11-26 DIAGNOSIS — F5101 Primary insomnia: Secondary | ICD-10-CM | POA: Insufficient documentation

## 2022-11-26 DIAGNOSIS — M1A0791 Idiopathic chronic gout, unspecified ankle and foot, with tophus (tophi): Secondary | ICD-10-CM | POA: Insufficient documentation

## 2022-11-26 DIAGNOSIS — G2581 Restless legs syndrome: Secondary | ICD-10-CM | POA: Insufficient documentation

## 2022-11-26 NOTE — Progress Notes (Unsigned)
Family Medicine, [facility]  [place] [address]  [phone]     Clinical Progress Note        Frederick Schultz  Date of Service: 11/26/2022    Chief complaint:   No chief complaint on file.      Subjective:    42 y.o.male with a past medical history of HTN, CKD III, mood disorder (GAD and MDD) and chronic back pain presents in follow up for chronic disease management.    In the interim from our last visit on 05/21/2022, management of chronic disease states is outlined below.     Chronically, his sleep is still poor. He has a tough time getting to sleep. He does get 6-7hrs of sleep but the hard part is getting to sleep. Did have a home sleep study but found it really hard to sleep with the home apparatus. [facility]     There has been some unexplainable appetite suppression. Mood has been about the same. He did start a higher dose of Abilify in Dec. Does notice he is not eating all of his portion. There is no issue with food insecurities.     Of note, he did have orange urine for 2 days and then it went back to normal. He has not seen anything else different since that time (which was about 4wks ago).     In regards to his RLS, symptoms are better than they were. Still a bit of a problem but better. Does think the ropinirole 2mg  nightly is helping.     In regards to hypertension: currently asymptomatic. Taking amlodipine 10mg  daily, metoprolol succinate 50mg  daily, triamterene-HCTZ 75mg -50mg  daily as instructed. Denies any adverse side effects.     In regards to his mood, his intrusive thoughts are better with latest dose increase in Abilify. He also notices the general doom and gloom thoughts are better. Does follow with [facility]. Currently taking aripiprazole 15mg  daily, paroxetine 30mg  daily, and hydroxyzine 50mg  Q8hrs prn    His shoulder still bothers him. It all started around the time he got the COVID booster.       Medications:  No outpatient medications have been  marked as taking for the 11/26/22 encounter (Appointment) with Elon Alas, DO.          Allergies:  Allergies   Allergen Reactions    Trazodone  Other Adverse Reaction (Add comment)     Priapism       Medical History:  Past Medical History:   Diagnosis Date    Anxiety     Asthma     Bipolar 1 disorder (CMS HCC)     Depression        Objective:  Blood pressure 106/78, pulse 77, temperature 37.2 C (98.9 F), temperature source Thermal Scan, weight 94.8 kg (208 lb 15.9 oz), SpO2 96%.  General: Well developed, well nourished. No acute distress  HEENT: Normocephalic, atraumatic. Extra occular movements intact bilaterally  Heart: Regular rate and rhythm, no appreciable murmurs, rubs, or gallops  Lungs: Clear to ascultation bilaterally, no appreciable wheezes, rales, or rhonchi  Abdomen: Bowel Sounds present in all 4 quadrants, soft, non-tender, non-distended, no appreciable rebound, guarding, or rigidity  Extremities: No appreciable clubbing, cyanosis, or edema  Psych: Awake, alert, pleasant, mood euthymic        Assessment/Plan:  Assessment/Plan   1. Unintentional weight loss of 10% body weight within 6 months    2. Essential hypertension    3.  RLS (restless legs syndrome)    4. Chronic left shoulder pain    5. Mood disorder (CMS HCC)   -- Chronic, persistent but stable. Cont to follow with [facility]  -- Cont aripiprazole 20mg  daily   -- Cont paroxetine 30mg  daily   -- Cont hydrozyxine 50mg  Q8hrs prn     6. Primary insomnia   -- message Psych to see about Belsomra     7. 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 Chronic left shoulder pain   -- Persistent and no improvement with PT or conservative OTC analgesics. Agreeable to referral to Sports Med for further evaluation  -- REFER to Sports Medicine     2. Essential hypertension   -- Well controlled. Discussed dose adjustments, but given stability, will plan to keep regimen the same  -- Cont  amlodipine 10mg  daily   -- Cont triamterene-HCTZ 75-50mg  daily  -- Cont metoprolol succinate 50mg  daily      3. RLS (restless legs syndrome)   -- Improved  -- Cont ropinirole 1mg  (2 tabs = 2mg ) nightly      4. Primary insomnia   -- Persistent but stable. Had an unreliable unattended sleep study. Will hold off at this time, but may need to consider in-lab sleep study in the future     5. Mood disorder (CMS HCC)   -- Persistent but somewhat improved. Cont to follow with [facility]         No follow-ups on file.      Jericha Bryden B. Elvera Maria, DO

## 2022-12-27 ENCOUNTER — Other Ambulatory Visit (HOSPITAL_COMMUNITY): Payer: Self-pay | Admitting: Student in an Organized Health Care Education/Training Program

## 2023-01-03 ENCOUNTER — Ambulatory Visit (INDEPENDENT_AMBULATORY_CARE_PROVIDER_SITE_OTHER): Payer: Medicaid Other | Admitting: Student in an Organized Health Care Education/Training Program

## 2023-01-03 ENCOUNTER — Other Ambulatory Visit: Payer: Self-pay

## 2023-01-03 VITALS — BP 116/76 | HR 69 | Resp 16 | Ht 66.0 in | Wt 211.0 lb

## 2023-01-03 DIAGNOSIS — Z9151 Personal history of suicidal behavior: Secondary | ICD-10-CM

## 2023-01-03 DIAGNOSIS — F410 Panic disorder [episodic paroxysmal anxiety] without agoraphobia: Secondary | ICD-10-CM

## 2023-01-03 DIAGNOSIS — F32A Depression, unspecified: Secondary | ICD-10-CM

## 2023-01-03 DIAGNOSIS — F1091 Alcohol use, unspecified, in remission: Secondary | ICD-10-CM

## 2023-01-03 DIAGNOSIS — Z79899 Other long term (current) drug therapy: Secondary | ICD-10-CM

## 2023-01-03 DIAGNOSIS — F439 Reaction to severe stress, unspecified: Secondary | ICD-10-CM

## 2023-01-03 DIAGNOSIS — G4700 Insomnia, unspecified: Secondary | ICD-10-CM

## 2023-01-03 DIAGNOSIS — F411 Generalized anxiety disorder: Secondary | ICD-10-CM

## 2023-01-03 MED ORDER — BUSPIRONE 15 MG TABLET
15.0000 mg | ORAL_TABLET | Freq: Every evening | ORAL | 3 refills | Status: DC
Start: 2023-01-03 — End: 2023-04-11

## 2023-01-03 MED ORDER — PAROXETINE 30 MG TABLET
ORAL_TABLET | ORAL | 3 refills | Status: DC
Start: 2023-01-03 — End: 2023-04-11

## 2023-01-03 MED ORDER — ARIPIPRAZOLE 20 MG TABLET
20.0000 mg | ORAL_TABLET | Freq: Every day | ORAL | 3 refills | Status: DC
Start: 2023-01-03 — End: 2023-04-11
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
## 2023-04-11 ENCOUNTER — Ambulatory Visit (INDEPENDENT_AMBULATORY_CARE_PROVIDER_SITE_OTHER): Payer: Medicaid Other | Admitting: Student in an Organized Health Care Education/Training Program

## 2023-04-11 ENCOUNTER — Other Ambulatory Visit: Payer: Self-pay

## 2023-04-11 VITALS — BP 111/74 | HR 65 | Ht 66.0 in | Wt 211.6 lb

## 2023-04-11 DIAGNOSIS — F4320 Adjustment disorder, unspecified: Secondary | ICD-10-CM

## 2023-04-11 DIAGNOSIS — F32A Depression, unspecified: Secondary | ICD-10-CM

## 2023-04-11 DIAGNOSIS — F1091 Alcohol use, unspecified, in remission: Secondary | ICD-10-CM

## 2023-04-11 DIAGNOSIS — Z8659 Personal history of other mental and behavioral disorders: Secondary | ICD-10-CM

## 2023-04-11 DIAGNOSIS — Z79899 Other long term (current) drug therapy: Secondary | ICD-10-CM

## 2023-04-11 DIAGNOSIS — F4010 Social phobia, unspecified: Secondary | ICD-10-CM

## 2023-04-11 DIAGNOSIS — F411 Generalized anxiety disorder: Secondary | ICD-10-CM

## 2023-04-11 MED ORDER — ARIPIPRAZOLE 20 MG TABLET
20.0000 mg | ORAL_TABLET | Freq: Every day | ORAL | 3 refills | Status: DC
Start: 2023-04-11 — End: 2023-09-05

## 2023-04-11 MED ORDER — PAROXETINE 30 MG TABLET
ORAL_TABLET | ORAL | 3 refills | Status: DC
Start: 2023-04-11 — End: 2023-09-05
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
## 2023-05-02 ENCOUNTER — Other Ambulatory Visit: Payer: Self-pay

## 2023-05-02 ENCOUNTER — Ambulatory Visit (INDEPENDENT_AMBULATORY_CARE_PROVIDER_SITE_OTHER): Payer: Medicaid Other | Admitting: Student in an Organized Health Care Education/Training Program

## 2023-05-02 VITALS — BP 122/78 | HR 71 | Ht 66.0 in | Wt 214.1 lb

## 2023-05-02 DIAGNOSIS — F411 Generalized anxiety disorder: Secondary | ICD-10-CM

## 2023-05-02 DIAGNOSIS — F439 Reaction to severe stress, unspecified: Secondary | ICD-10-CM

## 2023-05-02 DIAGNOSIS — F32A Depression, unspecified: Secondary | ICD-10-CM

## 2023-05-02 DIAGNOSIS — F1091 Alcohol use, unspecified, in remission: Secondary | ICD-10-CM

## 2023-05-02 DIAGNOSIS — F4010 Social phobia, unspecified: Secondary | ICD-10-CM

## 2023-05-02 DIAGNOSIS — Z79899 Other long term (current) drug therapy: Secondary | ICD-10-CM

## 2023-05-02 DIAGNOSIS — Z8659 Personal history of other mental and behavioral disorders: Secondary | ICD-10-CM

## 2023-05-02 MED ORDER — TADALAFIL 10 MG TABLET
10.0000 mg | ORAL_TABLET | ORAL | 0 refills | Status: AC | PRN
Start: 2023-05-02 — End: ?
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
## 2023-05-18 ENCOUNTER — Other Ambulatory Visit (HOSPITAL_BASED_OUTPATIENT_CLINIC_OR_DEPARTMENT_OTHER): Payer: Self-pay | Admitting: Family Medicine

## 2023-05-29 ENCOUNTER — Encounter (HOSPITAL_BASED_OUTPATIENT_CLINIC_OR_DEPARTMENT_OTHER): Payer: Self-pay | Admitting: Family Medicine

## 2023-05-29 ENCOUNTER — Ambulatory Visit: Payer: Medicaid Other | Attending: Family Medicine | Admitting: Family Medicine

## 2023-05-29 ENCOUNTER — Other Ambulatory Visit: Payer: Self-pay

## 2023-05-29 VITALS — BP 116/70 | HR 72 | Temp 97.4°F | Ht 66.0 in | Wt 217.4 lb

## 2023-05-29 DIAGNOSIS — F39 Unspecified mood [affective] disorder: Secondary | ICD-10-CM | POA: Insufficient documentation

## 2023-05-29 DIAGNOSIS — N183 Chronic kidney disease, stage 3 unspecified (CMS HCC): Secondary | ICD-10-CM | POA: Insufficient documentation

## 2023-05-29 DIAGNOSIS — Z79899 Other long term (current) drug therapy: Secondary | ICD-10-CM | POA: Insufficient documentation

## 2023-05-29 DIAGNOSIS — G4700 Insomnia, unspecified: Secondary | ICD-10-CM

## 2023-05-29 DIAGNOSIS — F5101 Primary insomnia: Secondary | ICD-10-CM | POA: Insufficient documentation

## 2023-05-29 DIAGNOSIS — G2581 Restless legs syndrome: Secondary | ICD-10-CM | POA: Insufficient documentation

## 2023-05-29 DIAGNOSIS — Z7185 Encounter for immunization safety counseling: Secondary | ICD-10-CM | POA: Insufficient documentation

## 2023-05-29 DIAGNOSIS — I129 Hypertensive chronic kidney disease with stage 1 through stage 4 chronic kidney disease, or unspecified chronic kidney disease: Secondary | ICD-10-CM | POA: Insufficient documentation

## 2023-05-29 DIAGNOSIS — M1A0791 Idiopathic chronic gout, unspecified ankle and foot, with tophus (tophi): Secondary | ICD-10-CM | POA: Insufficient documentation

## 2023-05-29 DIAGNOSIS — N529 Male erectile dysfunction, unspecified: Secondary | ICD-10-CM | POA: Insufficient documentation

## 2023-05-29 DIAGNOSIS — Z23 Encounter for immunization: Secondary | ICD-10-CM | POA: Insufficient documentation

## 2023-05-29 DIAGNOSIS — I10 Essential (primary) hypertension: Secondary | ICD-10-CM

## 2023-05-29 NOTE — Progress Notes (Signed)
Family Medicine, [facility]  [address]  [place] [address]  [phone]     Clinical Progress Note        Frederick Schultz  Date of Service: 05/29/2023    Chief complaint:   Chief Complaint   Patient presents with    Follow Up 6 Months       Subjective:    41 y.o.male with a past medical history of HTN, CKD III, mood disorder (GAD and MDD) and chronic back pain presents in follow up for chronic disease management.  \  In the interim from our last visit on 11/26/2022, management of chronic disease states is outlined below.     Recently, he has noticed some ED issues. Thought it was the Buspar, but he has been off of this for 4wks and no improvement. He was prescribed Cialis but has not taken it yet for fear of priaprism on the news.     Otherwise, feeling well and has no acute ccnerns today       Medications:  Outpatient Medications Marked as Taking for the 05/29/23 encounter (Office Visit) with Leda Roys, DO   Medication Sig    allopurinoL (ZYLOPRIM) 100 mg Oral Tablet Take 1 Tablet (100 mg total) by mouth Once a day    amLODIPine (NORVASC) 10 mg Oral Tablet Take 1 Tablet (10 mg total) by mouth Once a day    ARIPiprazole (ABILIFY) 20 mg Oral Tablet Take 1 Tablet (20 mg total) by mouth Once a day    metoprolol succinate (TOPROL-XL) 50 mg Oral Tablet Sustained Release 24 hr Take 1 Tablet (50 mg total) by mouth Once a day    PARoxetine (PAXIL) 30 mg Oral Tablet TAKE 2 TABLETS BY MOUTH ONCE DAILY IN THE EVENING    rOPINIRole (REQUIP) 1 mg Oral Tablet Take 2 Tablets (2 mg total) by mouth Every night    triamterene-hydroCHLOROthiazide (MAXZIDE) 75-50 mg Oral Tablet Take 1 Tablet by mouth Once a day          Allergies:  Allergies   Allergen Reactions    Trazodone  Other Adverse Reaction (Add comment)     Priapism       Medical History:  Past Medical History:   Diagnosis Date    Anxiety     Asthma     Bipolar 1 disorder (CMS HCC)     Depression        Objective:  Blood pressure  116/70, pulse 72, temperature 36.3 C (97.4 F), temperature source Thermal Scan, height 1.676 m (5\' 6" ), weight 98.6 kg (217 lb 6 oz), SpO2 96%.  General: Well developed, well nourished. No acute distress  HEENT: Normocephalic, atraumatic. Extra occular movements intact bilaterally  Heart: Regular rate and rhythm, no appreciable murmurs, rubs, or gallops  Lungs: Clear to ascultation bilaterally, no appreciable wheezes, rales, or rhonchi  Abdomen: Bowel Sounds present in all 4 quadrants, soft, non-tender, non-distended, no appreciable rebound, guarding, or rigidity  Extremities: No appreciable clubbing, cyanosis, or edema  Psych: Awake, alert, pleasant, mood euthymic      Labs:  No new labs since last visit on 11/26/2022        Assessment/Plan:  Assessment/Plan   1. Essential hypertension   -- Chronic, controlled  -- Cont amlodipine 10mg  daily   -- Cont triamterene-HCTZ 75-50mg  daily  -- Cont metoprolol succinate 50mg  daily  -- UPDATE CMP, CBC, and FLP     2. Erectile dysfunction, unspecified erectile dysfunction type   --  Acute. Likely adverse medication side effect, although if it were soley buspirone, I would have expected the situation to have corrected itself. Therefore, will order initial serology evaluation to assure there is no underlying metabolic cause. Also counseled on safety of PDE-5 inhibitors and encouraged him to try tadalafil  -- ORDER TSH, Testosterone, LH, and FSH  -- Cont tadalafil 10mg  daily prn (discussed he may need 2 tablets)     3. Primary insomnia   -- Chronic, persistent. Discussed overnight formal sleep study to determine best course of action, to which he is agreeable  -- ORDER in lab Polysomnography      4. RLS (restless legs syndrome)   -- Chronic, persistent, but controlled  -- Cont ropinirole 2mg  QHS     5. Mood disorder (CMS HCC)   -- Chronic, persistent but stable. Cont to follow with [facility]  -- Cont aripiprazole 20mg  daily   -- Cont paroxetine 30mg  daily   -- Cont  hydrozyxine 25mg  Q8hrs prn     6. 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 Immunization counseling   -- ORDER COVID  -- ORDER Influenza       Return in about 6 months (around 11/26/2023) for recheck HTN and HLD.      Sumer Moorehouse B. Vernard Gambles, DO

## 2023-06-03 ENCOUNTER — Other Ambulatory Visit (HOSPITAL_BASED_OUTPATIENT_CLINIC_OR_DEPARTMENT_OTHER): Payer: Self-pay | Admitting: Family Medicine

## 2023-08-01 ENCOUNTER — Encounter (HOSPITAL_COMMUNITY): Payer: Medicaid Other | Admitting: Student in an Organized Health Care Education/Training Program

## 2023-09-05 ENCOUNTER — Ambulatory Visit (INDEPENDENT_AMBULATORY_CARE_PROVIDER_SITE_OTHER): Payer: Medicaid Other | Admitting: Student in an Organized Health Care Education/Training Program

## 2023-09-05 ENCOUNTER — Other Ambulatory Visit: Payer: Self-pay

## 2023-09-05 ENCOUNTER — Other Ambulatory Visit (HOSPITAL_COMMUNITY): Payer: Self-pay | Admitting: Student in an Organized Health Care Education/Training Program

## 2023-09-05 DIAGNOSIS — F4010 Social phobia, unspecified: Secondary | ICD-10-CM

## 2023-09-05 DIAGNOSIS — F411 Generalized anxiety disorder: Secondary | ICD-10-CM

## 2023-09-05 DIAGNOSIS — F1091 Alcohol use, unspecified, in remission: Secondary | ICD-10-CM

## 2023-09-05 DIAGNOSIS — F439 Reaction to severe stress, unspecified: Secondary | ICD-10-CM

## 2023-09-05 DIAGNOSIS — F32A Depression, unspecified: Secondary | ICD-10-CM

## 2023-09-05 MED ORDER — PAROXETINE 30 MG TABLET
ORAL_TABLET | ORAL | 3 refills | Status: DC
Start: 2023-09-05 — End: 2023-11-05

## 2023-09-05 MED ORDER — BUSPIRONE 10 MG TABLET
10.0000 mg | ORAL_TABLET | Freq: Two times a day (BID) | ORAL | 2 refills | Status: DC
Start: 2023-09-05 — End: 2023-11-05

## 2023-09-05 MED ORDER — ARIPIPRAZOLE 20 MG TABLET
20.0000 mg | ORAL_TABLET | Freq: Every day | ORAL | 3 refills | Status: DC
Start: 2023-09-05 — End: 2023-11-05
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
## 2023-10-31 ENCOUNTER — Encounter (HOSPITAL_COMMUNITY): Payer: Self-pay | Admitting: Student in an Organized Health Care Education/Training Program

## 2023-11-02 ENCOUNTER — Other Ambulatory Visit (HOSPITAL_BASED_OUTPATIENT_CLINIC_OR_DEPARTMENT_OTHER): Payer: Self-pay | Admitting: Family Medicine

## 2023-11-05 ENCOUNTER — Other Ambulatory Visit: Payer: Self-pay

## 2023-11-05 ENCOUNTER — Ambulatory Visit (HOSPITAL_COMMUNITY): Payer: Medicaid Other | Admitting: Student in an Organized Health Care Education/Training Program

## 2023-11-05 VITALS — BP 114/79 | HR 73 | Resp 18 | Ht 66.0 in | Wt 217.8 lb

## 2023-11-05 DIAGNOSIS — F4010 Social phobia, unspecified: Secondary | ICD-10-CM

## 2023-11-05 DIAGNOSIS — F411 Generalized anxiety disorder: Secondary | ICD-10-CM

## 2023-11-05 DIAGNOSIS — F439 Reaction to severe stress, unspecified: Secondary | ICD-10-CM

## 2023-11-05 DIAGNOSIS — F329 Major depressive disorder, single episode, unspecified: Secondary | ICD-10-CM

## 2023-11-05 DIAGNOSIS — F1991 Other psychoactive substance use, unspecified, in remission: Secondary | ICD-10-CM

## 2023-11-05 MED ORDER — PAROXETINE 30 MG TABLET
ORAL_TABLET | ORAL | 3 refills | Status: DC
Start: 2023-11-05 — End: 2024-06-01

## 2023-11-05 MED ORDER — BUSPIRONE 15 MG TABLET
15.0000 mg | ORAL_TABLET | Freq: Two times a day (BID) | ORAL | 2 refills | Status: DC
Start: 2023-11-05 — End: 2024-05-19

## 2023-11-05 MED ORDER — ARIPIPRAZOLE 20 MG TABLET
20.0000 mg | ORAL_TABLET | Freq: Every day | ORAL | 3 refills | Status: DC
Start: 2023-11-05 — End: 2024-06-15
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
## 2023-11-16 ENCOUNTER — Other Ambulatory Visit (HOSPITAL_BASED_OUTPATIENT_CLINIC_OR_DEPARTMENT_OTHER): Payer: Self-pay | Admitting: Family Medicine

## 2023-11-16 DIAGNOSIS — I10 Essential (primary) hypertension: Secondary | ICD-10-CM

## 2023-11-25 ENCOUNTER — Ambulatory Visit: Payer: Self-pay | Admitting: Family Medicine

## 2023-11-27 ENCOUNTER — Ambulatory Visit (HOSPITAL_BASED_OUTPATIENT_CLINIC_OR_DEPARTMENT_OTHER): Payer: Self-pay | Admitting: Family Medicine

## 2023-11-29 ENCOUNTER — Other Ambulatory Visit (HOSPITAL_BASED_OUTPATIENT_CLINIC_OR_DEPARTMENT_OTHER): Payer: Self-pay | Admitting: Family Medicine

## 2023-11-29 DIAGNOSIS — I10 Essential (primary) hypertension: Secondary | ICD-10-CM
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
## 2024-01-28 ENCOUNTER — Other Ambulatory Visit: Payer: Self-pay

## 2024-01-28 ENCOUNTER — Ambulatory Visit (HOSPITAL_COMMUNITY): Payer: Self-pay | Admitting: PSYCHIATRY

## 2024-01-28 VITALS — BP 112/74 | HR 73 | Resp 17 | Ht 66.0 in | Wt 219.1 lb

## 2024-01-28 DIAGNOSIS — E663 Overweight: Secondary | ICD-10-CM

## 2024-01-28 DIAGNOSIS — F411 Generalized anxiety disorder: Secondary | ICD-10-CM

## 2024-01-28 DIAGNOSIS — F1021 Alcohol dependence, in remission: Secondary | ICD-10-CM

## 2024-01-28 DIAGNOSIS — G4700 Insomnia, unspecified: Secondary | ICD-10-CM

## 2024-01-28 DIAGNOSIS — Z79899 Other long term (current) drug therapy: Secondary | ICD-10-CM

## 2024-01-28 DIAGNOSIS — F32A Depression, unspecified: Secondary | ICD-10-CM

## 2024-01-28 DIAGNOSIS — E669 Obesity, unspecified: Secondary | ICD-10-CM

## 2024-01-28 DIAGNOSIS — F439 Reaction to severe stress, unspecified: Secondary | ICD-10-CM

## 2024-03-18 ENCOUNTER — Ambulatory Visit (HOSPITAL_BASED_OUTPATIENT_CLINIC_OR_DEPARTMENT_OTHER): Admitting: Family Medicine
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
## 2024-03-27 ENCOUNTER — Encounter (HOSPITAL_COMMUNITY): Payer: Self-pay | Admitting: PSYCHIATRY

## 2024-05-18 ENCOUNTER — Other Ambulatory Visit (HOSPITAL_COMMUNITY): Payer: Self-pay | Admitting: Student in an Organized Health Care Education/Training Program

## 2024-05-18 NOTE — Telephone Encounter (Signed)
 Message from Arthea HERO sent at 05/18/2024  4:52 PM EDT    Summary: Refill Request    Copied From CRM [id number].  [employer] administrator) is requesting a refill for the following medication(s):    busPIRone  (BUSPAR ) 15 mg Oral Tablet    Pharmacy:  [id number] - [place], [facility] - [address]  [address] [place] [address]  Phone: [phone] Fax: [phone]  Hours: Not open 24 hours    Thanks,    Darlyn Morna Favre, RN

## 2024-05-18 NOTE — Telephone Encounter (Signed)
 Refill requested for the following medication(s). Last ordered:      busPIRone  (BUSPAR ) 15 mg Oral Tablet Take 1 Tablet (15 mg total) by mouth Twice daily Dispense: 60 Tablet, Refills: 2 ordered  11/05/2023     Last appointment: 01/28/24  No show 03/27/24    Follow up:    Not currently scheduled    Morna Favre, RN  05/18/2024, 14:31

## 2024-05-31 ENCOUNTER — Other Ambulatory Visit (HOSPITAL_BASED_OUTPATIENT_CLINIC_OR_DEPARTMENT_OTHER): Payer: Self-pay | Admitting: Family Medicine

## 2024-05-31 ENCOUNTER — Other Ambulatory Visit (HOSPITAL_COMMUNITY): Payer: Self-pay | Admitting: Student in an Organized Health Care Education/Training Program

## 2024-06-01 ENCOUNTER — Encounter (HOSPITAL_COMMUNITY): Payer: Self-pay

## 2024-06-01 NOTE — Telephone Encounter (Signed)
 Refill requested for the following medication(s). Last ordered:      PARoxetine  (PAXIL ) 30 mg Oral Tablet TAKE 2 TABLETS BY MOUTH ONCE DAILY IN THE EVENING Dispense: 60 Tablet, Refills: 3 ordered  11/05/2023     Last appointment: 01/28/24  No show 03/27/24    Follow up:    MyChart message sent to patient with phone number to call and schedule appointment.    Morna Favre, RN  06/01/2024, 14:14

## 2024-06-15 ENCOUNTER — Other Ambulatory Visit (HOSPITAL_COMMUNITY): Payer: Self-pay | Admitting: Student in an Organized Health Care Education/Training Program

## 2024-06-15 NOTE — Telephone Encounter (Signed)
 Refill requested for the following medication(s). Last ordered:      ARIPiprazole  (ABILIFY ) 20 mg Oral Tablet Take 1 Tablet (20 mg total) by mouth Once a day Dispense: 30 Tablet, Refills: 3 ordered  11/05/2023       Last appointment: 01/28/24  No show 03/27/24    Follow up:    Not scheduled    Larraine Nail, RN  06/15/2024, 09:22

## 2024-06-24 ENCOUNTER — Other Ambulatory Visit (HOSPITAL_BASED_OUTPATIENT_CLINIC_OR_DEPARTMENT_OTHER): Payer: Self-pay | Admitting: Family Medicine

## 2024-06-25 MED ORDER — TRIAMTERENE 75 MG-HYDROCHLOROTHIAZIDE 50 MG TABLET
1.0000 | ORAL_TABLET | Freq: Every day | ORAL | 0 refills | Status: DC
Start: 2024-06-25 — End: 2024-07-26

## 2024-06-25 NOTE — Telephone Encounter (Signed)
 Regarding: Refill Request  ----- Message from Angeline ORN sent at 06/24/2024 12:28 PM EDT -----  Copied From CRM [id number].[facility] (Pharmacy) called to request a prescription refill.   triamterene -hydroCHLOROthiazide  (MAXZIDE ) 75-50 mg Oral Tablet 90 Tablet 3 06/04/2023 -   Sig - Route: Take 1 Tablet by mouth Once a day - Oral   Sent to pharmacy as: triamterene  75 mg-hydrochlorothiazide  50 mg tablet (MAXZIDE )   Class: E-Rx     Preferred Pharmacy     [id number] - [place], [place] - [address]    [address] [place] [address]    Phone: [phone] Fax: [phone]    Hours: Not open 24 hours

## 2024-06-25 NOTE — Telephone Encounter (Signed)
 Last seen 05/2023  Attempted to reach patient. No answer. VM left asking asking for return call to clinic to schedule.     30 day supply pended.   Daleisa Halperin E Doak Mah, RN

## 2024-06-29 ENCOUNTER — Other Ambulatory Visit (HOSPITAL_COMMUNITY): Payer: Self-pay | Admitting: PSYCHIATRY

## 2024-06-30 NOTE — Telephone Encounter (Signed)
 Refill requested for the following medication(s). Last ordered:      PARoxetine  (PAXIL ) 30 mg Oral Tablet TAKE 2 TABLETS BY MOUTH ONCE DAILY IN THE EVENING Dispense: 60 Tablet, Refills: 0 ordered  06/01/2024      Last appointment: 01/28/24  No show 03/27/24    Follow up:    Not currently scheduled    Morna Favre, RN  06/30/2024, 11:13

## 2024-07-13 ENCOUNTER — Other Ambulatory Visit (HOSPITAL_COMMUNITY): Payer: Self-pay | Admitting: PSYCHIATRY

## 2024-07-13 NOTE — Telephone Encounter (Signed)
 Patient will need a follow-up appointment with Dr. Jackquline for additional refills. Thanks

## 2024-07-13 NOTE — Telephone Encounter (Signed)
 Refill requested for the following medication(s). Last ordered:      ARIPiprazole  (ABILIFY ) 20 mg Oral Tablet Take 1 Tablet (20 mg total) by mouth Daily Dispense: 30 Tablet, Refills: 0 ordered  06/15/2024       Last appointment: 01/28/24  No show 03/27/24    Follow up:    Not scheduled currently     Larraine Nail, RN  07/13/2024, 14:44

## 2024-07-23 ENCOUNTER — Other Ambulatory Visit (HOSPITAL_COMMUNITY): Payer: Self-pay | Admitting: PSYCHIATRY

## 2024-07-23 ENCOUNTER — Other Ambulatory Visit (HOSPITAL_BASED_OUTPATIENT_CLINIC_OR_DEPARTMENT_OTHER): Payer: Self-pay | Admitting: Family Medicine

## 2024-07-23 NOTE — Telephone Encounter (Signed)
 Refill requested for the following medication(s). Last ordered:      busPIRone  (BUSPAR ) 15 mg Oral Tablet Take 1 tablet by mouth twice daily Dispense: 60 Tablet, Refills: 0 ordered  05/19/2024     Last appointment: 01/28/24    Follow up:    Not currently scheduled    Morna Favre, RN  07/23/2024, 15:57

## 2024-08-07 ENCOUNTER — Other Ambulatory Visit (HOSPITAL_COMMUNITY): Payer: Self-pay

## 2024-08-10 NOTE — Telephone Encounter (Signed)
 Refill requested for the following medication(s). Last ordered:      PARoxetine  (PAXIL ) 30 mg Oral Tablet TAKE 2 TABLETS BY MOUTH ONCE DAILY IN THE EVENING Dispense: 60 Tablet, Refills: 0 ordered  06/30/2024       Last appointment: 01/28/24  No show 03/27/24    Follow up:    Not scheduled currently     Larraine Nail, RN  08/10/2024, 11:22

## 2024-08-16 ENCOUNTER — Other Ambulatory Visit (HOSPITAL_COMMUNITY): Payer: Self-pay | Admitting: PSYCHIATRY

## 2024-08-17 NOTE — Telephone Encounter (Signed)
 Refill requested for the following medication(s). Last ordered:      ARIPiprazole  (ABILIFY ) 20 mg Oral Tablet Take 1 Tablet (20 mg total) by mouth Daily Dispense: 30 Tablet, Refills: 0 ordered  07/13/2024     Last appointment: 01/28/24    Follow up:    Not scheduled    Morna Favre, RN  08/17/2024, 14:15

## 2024-08-23 ENCOUNTER — Other Ambulatory Visit (HOSPITAL_COMMUNITY): Payer: Self-pay | Admitting: PSYCHIATRY

## 2024-08-24 NOTE — Telephone Encounter (Signed)
 Refill requested for the following medication(s). Last ordered:      ARIPiprazole  (ABILIFY ) 20 mg Oral Tablet Take 1 tablet by mouth once daily Dispense: 30 Tablet, Refills: 0 ordered  08/18/2024     Last appointment: 01/28/24    Follow up:    Not currently scheduled    Morna Favre, RN  08/24/2024, 14:41

## 2024-08-31 ENCOUNTER — Other Ambulatory Visit (HOSPITAL_BASED_OUTPATIENT_CLINIC_OR_DEPARTMENT_OTHER): Payer: Self-pay | Admitting: Family Medicine

## 2024-09-01 NOTE — Telephone Encounter (Signed)
 This is the follow up refill for a 30 day supply when we sent him a message last month suggesting he needs to schedule a follow up.    Schedulers, can you please call to have him set up a visit in the near future? It can be with a resident while I am precepting. Staff and schedulers, can you please notify him that this will be the last refill until he sets up an appointment.    Thanks,    Lamar Rake, DO

## 2024-09-01 NOTE — Telephone Encounter (Signed)
 Regarding: Refill Request  ----- Message from [facility] H sent at 09/01/2024  1:10 PM EST -----  Copied From CRM #[id number].Claudene Mallick F (Self) called to request a prescription refill.   triamterene -hydroCHLOROthiazide  (MAXZIDE ) 75-50 mg Oral Tablet     Preferred Pharmacy     [id number] - [place], [place] - [address] [place] [address]    Phone: [phone] Fax: [phone]    Hours: Not open 24 hours

## 2024-09-02 ENCOUNTER — Encounter (HOSPITAL_BASED_OUTPATIENT_CLINIC_OR_DEPARTMENT_OTHER): Payer: Self-pay

## 2024-09-06 ENCOUNTER — Other Ambulatory Visit (HOSPITAL_COMMUNITY): Payer: Self-pay | Admitting: PSYCHIATRY

## 2024-09-07 NOTE — Telephone Encounter (Signed)
 Refill requested for the following medication(s). Last ordered:      PARoxetine  (PAXIL ) 30 mg Oral Tablet Take 2 Tablets (60 mg total) by mouth Every evening Dispense: 60 Tablet, Refills: 0 ordered  08/10/2024       Last appointment: 01/28/24  No show 03/27/24    Follow up:    Not scheduled currently     Larraine Nail, RN  09/07/2024, 10:46

## 2024-09-11 ENCOUNTER — Other Ambulatory Visit (HOSPITAL_BASED_OUTPATIENT_CLINIC_OR_DEPARTMENT_OTHER): Payer: Self-pay | Admitting: Family Medicine

## 2024-10-04 ENCOUNTER — Other Ambulatory Visit (HOSPITAL_COMMUNITY): Payer: Self-pay | Admitting: PSYCHIATRY

## 2024-10-04 ENCOUNTER — Other Ambulatory Visit (HOSPITAL_BASED_OUTPATIENT_CLINIC_OR_DEPARTMENT_OTHER): Payer: Self-pay | Admitting: Family Medicine

## 2024-10-05 NOTE — Telephone Encounter (Signed)
 Refill requested for the following medication(s). Last ordered:      busPIRone  (BUSPAR ) 15 mg Oral Tablet Take 1 tablet by mouth twice daily Dispense: 60 Tablet, Refills: 0 ordered  07/23/2024       Last appointment: 01/28/24  No show 03/27/24    Follow up:    Not scheduled currently     Larraine Nail, RN  10/05/2024, 10:56

## 2024-10-06 NOTE — Telephone Encounter (Signed)
 Patient has an upcoming appointment 10/12/24.   Paulina Hug, Ambulatory Care Assistant

## 2024-10-11 ENCOUNTER — Other Ambulatory Visit (HOSPITAL_COMMUNITY): Payer: Self-pay | Admitting: PSYCHIATRY

## 2024-10-12 ENCOUNTER — Other Ambulatory Visit: Payer: Self-pay

## 2024-10-12 ENCOUNTER — Encounter (HOSPITAL_BASED_OUTPATIENT_CLINIC_OR_DEPARTMENT_OTHER): Payer: Self-pay | Admitting: Family Medicine

## 2024-10-12 ENCOUNTER — Ambulatory Visit: Payer: Self-pay | Attending: Family Medicine | Admitting: Family Medicine

## 2024-10-12 VITALS — BP 118/84 | HR 84 | Temp 97.8°F | Ht 65.75 in | Wt 234.8 lb

## 2024-10-12 DIAGNOSIS — B354 Tinea corporis: Secondary | ICD-10-CM

## 2024-10-12 DIAGNOSIS — M1A0791 Idiopathic chronic gout, unspecified ankle and foot, with tophus (tophi): Secondary | ICD-10-CM | POA: Insufficient documentation

## 2024-10-12 DIAGNOSIS — I10 Essential (primary) hypertension: Secondary | ICD-10-CM

## 2024-10-12 DIAGNOSIS — G2581 Restless legs syndrome: Secondary | ICD-10-CM | POA: Insufficient documentation

## 2024-10-12 DIAGNOSIS — F5101 Primary insomnia: Secondary | ICD-10-CM | POA: Insufficient documentation

## 2024-10-12 DIAGNOSIS — F39 Unspecified mood [affective] disorder: Secondary | ICD-10-CM | POA: Insufficient documentation

## 2024-10-12 DIAGNOSIS — G252 Other specified forms of tremor: Secondary | ICD-10-CM | POA: Insufficient documentation

## 2024-10-12 MED ORDER — ROPINIROLE 3 MG TABLET: 3.0000 mg | ORAL_TABLET | Freq: Every evening | ORAL | 1 refills | Status: AC

## 2024-10-12 MED ORDER — CLOTRIMAZOLE 1 % TOPICAL CREAM: TOPICAL_CREAM | Freq: Two times a day (BID) | CUTANEOUS | 0 refills | Status: AC

## 2024-10-12 MED ORDER — ALLOPURINOL 100 MG TABLET: 100.0000 mg | ORAL_TABLET | Freq: Every day | ORAL | 3 refills | Status: AC

## 2025-01-27 ENCOUNTER — Ambulatory Visit (HOSPITAL_BASED_OUTPATIENT_CLINIC_OR_DEPARTMENT_OTHER): Payer: Self-pay | Admitting: Family Medicine
